# Patient Record
Sex: Female | Born: 1980 | Race: White | Hispanic: No | Marital: Married | Smoking: Current every day smoker
Health system: Southern US, Community
[De-identification: ages and names within clinical notes are randomized; demographics above are authoritative.]

## PROBLEM LIST (undated history)

## (undated) DIAGNOSIS — F32A Depression, unspecified: Secondary | ICD-10-CM

## (undated) DIAGNOSIS — I10 Essential (primary) hypertension: Secondary | ICD-10-CM

## (undated) DIAGNOSIS — F419 Anxiety disorder, unspecified: Secondary | ICD-10-CM

## (undated) HISTORY — DX: Anxiety disorder, unspecified: F41.9

## (undated) HISTORY — DX: Depression, unspecified: F32.A

## (undated) HISTORY — PX: CHOLECYSTECTOMY: SHX55

## (undated) HISTORY — PX: ABDOMINAL HYSTERECTOMY: SHX81

## (undated) HISTORY — PX: OTHER SURGICAL HISTORY: SHX169

---

## 2018-08-07 ENCOUNTER — Other Ambulatory Visit: Payer: Self-pay

## 2018-08-07 ENCOUNTER — Emergency Department (HOSPITAL_COMMUNITY)
Admission: EM | Admit: 2018-08-07 | Discharge: 2018-08-08 | Disposition: A | Discharge status: Home / Self Care | Payer: Self-pay | Attending: Emergency Medicine | Admitting: Emergency Medicine

## 2018-08-07 ENCOUNTER — Encounter (HOSPITAL_COMMUNITY): Payer: Self-pay | Admitting: Emergency Medicine

## 2018-08-07 DIAGNOSIS — R748 Abnormal levels of other serum enzymes: Secondary | ICD-10-CM | POA: Insufficient documentation

## 2018-08-07 DIAGNOSIS — R309 Painful micturition, unspecified: Secondary | ICD-10-CM | POA: Insufficient documentation

## 2018-08-07 DIAGNOSIS — R112 Nausea with vomiting, unspecified: Secondary | ICD-10-CM

## 2018-08-07 DIAGNOSIS — R300 Dysuria: Secondary | ICD-10-CM | POA: Insufficient documentation

## 2018-08-07 DIAGNOSIS — N3000 Acute cystitis without hematuria: Secondary | ICD-10-CM | POA: Insufficient documentation

## 2018-08-07 DIAGNOSIS — F17210 Nicotine dependence, cigarettes, uncomplicated: Secondary | ICD-10-CM | POA: Insufficient documentation

## 2018-08-07 DIAGNOSIS — E876 Hypokalemia: Secondary | ICD-10-CM | POA: Insufficient documentation

## 2018-08-07 LAB — COMPREHENSIVE METABOLIC PANEL
ALBUMIN: 4.4 g/dL (ref 3.5–5.0)
ALT: 76 U/L — AB (ref 0–44)
AST: 59 U/L — AB (ref 15–41)
Alkaline Phosphatase: 69 U/L (ref 38–126)
Anion gap: 9 (ref 5–15)
BUN: 13 mg/dL (ref 6–20)
CHLORIDE: 105 mmol/L (ref 98–111)
CO2: 23 mmol/L (ref 22–32)
CREATININE: 0.8 mg/dL (ref 0.44–1.00)
Calcium: 9 mg/dL (ref 8.9–10.3)
GFR calc Af Amer: >60 mL/min (ref >60)
GFR calc non Af Amer: >60 mL/min (ref >60)
Glucose, Bld: 126 mg/dL — ABNORMAL HIGH (ref 70–99)
Potassium: 3.1 mmol/L — ABNORMAL LOW (ref 3.5–5.1)
Sodium: 137 mmol/L (ref 135–145)
Total Bilirubin: 0.9 mg/dL (ref 0.3–1.2)
Total Protein: 7.7 g/dL (ref 6.5–8.1)

## 2018-08-07 LAB — CBC
HEMATOCRIT: 44.2 % (ref 36.0–46.0)
Hemoglobin: 14.5 g/dL (ref 12.0–15.0)
MCH: 28.9 pg (ref 26.0–34.0)
MCHC: 32.8 g/dL (ref 30.0–36.0)
MCV: 88.2 fL (ref 80.0–100.0)
NRBC: 0 % (ref 0.0–0.2)
Platelets: 325 10*3/uL (ref 150–400)
RBC: 5.01 MIL/uL (ref 3.87–5.11)
RDW: 12.6 % (ref 11.5–15.5)
WBC: 8 10*3/uL (ref 4.0–10.5)

## 2018-08-07 LAB — LIPASE, BLOOD: LIPASE: 26 U/L (ref 11–51)

## 2018-08-07 MED ORDER — SODIUM CHLORIDE 0.9 % IV BOLUS
1000.0000 mL | Freq: Once | INTRAVENOUS | Status: AC
  Administered 2018-08-07: 1000 mL via INTRAVENOUS

## 2018-08-07 MED ORDER — ONDANSETRON HCL 4 MG/2ML IJ SOLN
4.0000 mg | Freq: Once | INTRAMUSCULAR | Status: AC
  Administered 2018-08-07: 4 mg via INTRAVENOUS
  Filled 2018-08-07: qty 2

## 2018-08-07 NOTE — ED Triage Notes (Signed)
Pt states she has been having emesis since yesterday as well as dysuria and "feels kind of swollen". Foul odor to urine noted.

## 2018-08-07 NOTE — ED Provider Notes (Signed)
[facility] EMERGENCY DEPARTMENT Provider Note   CSN: [id number] Arrival date & time: 08/07/18  2216     History   Chief Complaint Chief Complaint  Patient presents with  . Emesis    HPI Hannah Reid is a 37 y.o. female presenting with intermittent episodes of vomiting, describing she spent the majority of last weekend with nausea and infrequent emesis, felt better during the week, but then again had nausea with several episodes of vomiting for the past 2 days.  Additionally, she endorses dysuria, including painful urination, strong smelling urine with urgency and increased frequency with cloudy but not bloody urine production.  She denies fevers or chills, no flank pain but does have aching across her bilateral lower back.  She does have infrequent problems with uti's.  She denies history of kidney stones. Denies abdominal pain except for midline suprapubic pressure, denies vaginal discharge, also no complaint of diarrhea.  She has had no tx prior to arrival.  The history is provided by the patient.    History reviewed. No pertinent past medical history.  There are no active problems to display for this patient.   Past Surgical History:  Procedure Laterality Date  . ABDOMINAL HYSTERECTOMY    . CHOLECYSTECTOMY    . kidney tumor removal       OB History   None      Home Medications    Prior to Admission medications   Medication Sig Start Date End Date Taking? Authorizing Provider  cephALEXin (KEFLEX) 500 MG capsule Take 1 capsule (500 mg total) by mouth 2 (two) times daily. 08/08/18   Burgess Amor, PA-C  potassium chloride SA (K-DUR,KLOR-CON) 20 MEQ tablet Take 1 tablet (20 mEq total) by mouth 2 (two) times daily. 08/08/18   Burgess Amor, PA-C  promethazine (PHENERGAN) 25 MG tablet Take 1 tablet (25 mg total) by mouth every 6 (six) hours as needed for nausea or vomiting. 08/08/18   Burgess Amor, PA-C    Family History History reviewed. No pertinent family  history.  Social History Social History   Tobacco Use  . Smoking status: Current Every Day Smoker    Packs/day: 0.50    Types: Cigarettes  . Smokeless tobacco: Never Used  Substance Use Topics  . Alcohol use: Yes    Comment: socially  . Drug use: Not Currently     Allergies   Patient has no known allergies.   Review of Systems Review of Systems  Constitutional: Negative for chills and fever.  HENT: Negative for congestion and sore throat.   Eyes: Negative.   Respiratory: Negative for chest tightness and shortness of breath.   Cardiovascular: Negative for chest pain.  Gastrointestinal: Positive for nausea and vomiting. Negative for abdominal pain.  Genitourinary: Positive for dysuria, frequency and urgency. Negative for flank pain and vaginal discharge.  Musculoskeletal: Negative for arthralgias, joint swelling and neck pain.  Skin: Negative.  Negative for rash and wound.  Neurological: Negative for dizziness, weakness, light-headedness, numbness and headaches.  Psychiatric/Behavioral: Negative.      Physical Exam Updated Vital Signs BP (!) 132/91 (BP Location: Right Arm)   Pulse 75   Temp 97.9 F (36.6 C) (Oral)   Resp 17   Ht 5\' 5"  (1.651 m)   Wt 120.7 kg   SpO2 99%   BMI 44.28 kg/m   Physical Exam  Constitutional: She appears well-developed and well-nourished.  HENT:  Head: Normocephalic and atraumatic.  Eyes: Conjunctivae are normal.  Neck: Normal range of motion.  Cardiovascular: Normal rate, regular rhythm, normal heart sounds and intact distal pulses.  Pulmonary/Chest: Effort normal and breath sounds normal. She has no wheezes.  Abdominal: Soft. Bowel sounds are normal. She exhibits no mass. There is no tenderness. There is no guarding.  No cva tenderness  Musculoskeletal: Normal range of motion.  Neurological: She is alert.  Skin: Skin is warm and dry.  Psychiatric: She has a normal mood and affect.  Nursing note and vitals reviewed.    ED  Treatments / Results  Labs (all labs ordered are listed, but only abnormal results are displayed) Labs Reviewed  COMPREHENSIVE METABOLIC PANEL - Abnormal; Notable for the following components:      Result Value   Potassium 3.1 (*)    Glucose, Bld 126 (*)    AST 59 (*)    ALT 76 (*)    All other components within normal limits  URINALYSIS, ROUTINE W REFLEX MICROSCOPIC - Abnormal; Notable for the following components:   APPearance HAZY (*)    Hgb urine dipstick SMALL (*)    Nitrite POSITIVE (*)    Leukocytes, UA TRACE (*)    Bacteria, UA MANY (*)    All other components within normal limits  LIPASE, BLOOD  CBC    EKG None  Radiology No results found.  Procedures Procedures (including critical care time)  Medications Ordered in ED Medications  sodium chloride 0.9 % bolus 1,000 mL (0 mLs Intravenous Stopped 08/08/18 0030)  ondansetron (ZOFRAN) injection 4 mg (4 mg Intravenous Given 08/07/18 2343)  potassium chloride SA (K-DUR,KLOR-CON) CR tablet 40 mEq (40 mEq Oral Given 08/08/18 0031)  cephALEXin (KEFLEX) capsule 500 mg (500 mg Oral Given 08/08/18 0104)     Initial Impression / Assessment and Plan / ED Course  I have reviewed the triage vital signs and the nursing notes.  Pertinent labs & imaging results that were available during my care of the patient were reviewed by me and considered in my medical decision making (see chart for details).     Labs reviewed and discussed with patient including low potassium and liver enzyme findings.  Pt explains that she had a "scare" last year prior to moving to [place] to live with mother - stating a local health dept told her her Hep C screening test was positive. She was supposed to return for further testing but never did.  She denies excessive etoh use, last drank one beer 3 days ago.  Pt has a normal lipase so unlikely to be related to gallbladder disease. Pt was strongly encouraged to establish local care here for further evaluation  and management of these findings.  She was started on keflex for the uti, potassium also prescribed for replacement, phenergen for nausea. Strict return precautions discussed.  No cva tenderness, afebrile, doubt pyelonephritis.  Pt tolerated PO intake prior to dc home after tx with zofran.  Prescribed phenergan as pt more likely to be able to afford.  Final Clinical Impressions(s) / ED Diagnoses   Final diagnoses:  Acute cystitis without hematuria  Hypokalemia  Nausea and vomiting, intractability of vomiting not specified, unspecified vomiting type  Elevated liver enzymes    ED Discharge Orders         Ordered    cephALEXin (KEFLEX) 500 MG capsule  2 times daily     08/08/18 0046    promethazine (PHENERGAN) 25 MG tablet  Every 6 hours PRN     08/08/18 0046    potassium chloride SA (K-DUR,KLOR-CON) 20 MEQ  tablet  2 times daily     08/08/18 0046           Burgess Amor, PA-C 08/08/18 1327    Mancel Bale, MD 08/08/18 2351

## 2018-08-08 LAB — URINALYSIS, ROUTINE W REFLEX MICROSCOPIC
Bilirubin Urine: NEGATIVE
Glucose, UA: NEGATIVE mg/dL
Ketones, ur: NEGATIVE mg/dL
Nitrite: POSITIVE — AB
Protein, ur: NEGATIVE mg/dL
Specific Gravity, Urine: 1.02 (ref 1.005–1.030)
pH: 5 (ref 5.0–8.0)

## 2018-08-08 MED ORDER — POTASSIUM CHLORIDE CRYS ER 20 MEQ PO TBCR
40.0000 meq | EXTENDED_RELEASE_TABLET | Freq: Once | ORAL | Status: AC
  Administered 2018-08-08: 40 meq via ORAL
  Filled 2018-08-08: qty 2

## 2018-08-08 MED ORDER — CEPHALEXIN 500 MG PO CAPS
500.0000 mg | ORAL_CAPSULE | Freq: Once | ORAL | Status: AC
  Administered 2018-08-08: 500 mg via ORAL
  Filled 2018-08-08: qty 1

## 2018-08-08 MED ORDER — POTASSIUM CHLORIDE CRYS ER 20 MEQ PO TBCR: 20.0000 meq | EXTENDED_RELEASE_TABLET | Freq: Two times a day (BID) | ORAL | 0 refills | Status: DC

## 2018-08-08 MED ORDER — CEPHALEXIN 500 MG PO CAPS: 500.0000 mg | ORAL_CAPSULE | Freq: Two times a day (BID) | ORAL | 0 refills | Status: DC

## 2018-08-08 MED ORDER — PROMETHAZINE HCL 25 MG PO TABS: 25.0000 mg | ORAL_TABLET | Freq: Four times a day (QID) | ORAL | 0 refills | Status: DC | PRN

## 2018-08-08 NOTE — Discharge Instructions (Signed)
Take the entire course of the antibiotics prescribed, make sure you are drinking plenty of fluids to help flush this infection from your bladder.  Your potassium level was low this evening, possibly due to the vomiting symptom.  I have prescribed you a quantity of potassium tablets to help replace this deficit.  As discussed your liver enzymes are slightly elevated today and will need to get rechecked soon along with possible further testing such as repeating your reported positive hepatitis C test from several years ago.  See the resources above for clinics where you may establish primary care.    [facility]  [address] [place], [place] [address] [phone]  Services [facility] offers a variety of basic health services.  Services include but are not limited to: Blood pressure checks  Heart rate checks  Blood sugar checks  Urine analysis  Rapid strep tests  Pregnancy tests.  Health education and referrals  People needing more complex services will be directed to a physician online. Using these virtual visits, doctors can evaluate and prescribe medicine and treatments. There will be no medication on-site, though [facility] will help patients fill their prescriptions at little to no cost.   For More information please go to: [email or web address]

## 2021-08-09 ENCOUNTER — Other Ambulatory Visit: Payer: Self-pay

## 2021-08-09 ENCOUNTER — Emergency Department
Admission: EM | Admit: 2021-08-09 | Discharge: 2021-08-09 | Disposition: A | Discharge status: Home / Self Care | Payer: Self-pay | Attending: Emergency Medicine | Admitting: Emergency Medicine

## 2021-08-09 DIAGNOSIS — M542 Cervicalgia: Secondary | ICD-10-CM | POA: Insufficient documentation

## 2021-08-09 DIAGNOSIS — F17210 Nicotine dependence, cigarettes, uncomplicated: Secondary | ICD-10-CM | POA: Insufficient documentation

## 2021-08-09 DIAGNOSIS — M25511 Pain in right shoulder: Secondary | ICD-10-CM | POA: Insufficient documentation

## 2021-08-09 MED ORDER — NAPROXEN 500 MG PO TABS: 500.0000 mg | ORAL_TABLET | Freq: Two times a day (BID) | ORAL | 2 refills | Status: DC

## 2021-08-09 MED ORDER — KETOROLAC TROMETHAMINE 30 MG/ML IJ SOLN
30.0000 mg | Freq: Once | INTRAMUSCULAR | Status: AC
  Administered 2021-08-09: 30 mg via INTRAMUSCULAR
  Filled 2021-08-09: qty 1

## 2021-08-09 MED ORDER — PREDNISONE 20 MG PO TABS
60.0000 mg | ORAL_TABLET | Freq: Once | ORAL | Status: AC
  Administered 2021-08-09: 60 mg via ORAL
  Filled 2021-08-09: qty 3

## 2021-08-09 MED ORDER — PREDNISONE 50 MG PO TABS: 50.0000 mg | ORAL_TABLET | Freq: Every day | ORAL | 0 refills | Status: DC

## 2021-08-09 NOTE — ED Triage Notes (Signed)
Pt c/o pain that started In her right FA and is now the whole arm in to the neck and shoulder, worse with movement, states it started at work

## 2021-08-09 NOTE — ED Provider Notes (Signed)
[facility] Emergency Department Provider Note   ____________________________________________    I have reviewed the triage vital signs and the nursing notes.   HISTORY  Chief Complaint Neck Pain and Arm Pain     HPI Hannah Reid is a 40 y.o. female who presents with complaints of right-sided neck pain and shoulder pain.  Patient reports over the last several days it has become quite painful for her to try to abduct her right arm.  She has pain in her shoulder and in her neck when she does this.  Occasionally radiates down to her right hand.  She denies numbness or weakness.  Denies injury to the area.  Has never had this before.  History reviewed. No pertinent past medical history.  There are no problems to display for this patient.   Past Surgical History:  Procedure Laterality Date   ABDOMINAL HYSTERECTOMY     CHOLECYSTECTOMY     kidney tumor removal      Prior to Admission medications   Medication Sig Start Date End Date Taking? Authorizing Provider  naproxen (NAPROSYN) 500 MG tablet Take 1 tablet (500 mg total) by mouth 2 (two) times daily with a meal. 08/09/21  Yes Jene Every, MD  predniSONE (DELTASONE) 50 MG tablet Take 1 tablet (50 mg total) by mouth daily with breakfast. 08/10/21  Yes Jene Every, MD  cephALEXin (KEFLEX) 500 MG capsule Take 1 capsule (500 mg total) by mouth 2 (two) times daily. 08/08/18   Burgess Amor, PA-C  potassium chloride SA (K-DUR,KLOR-CON) 20 MEQ tablet Take 1 tablet (20 mEq total) by mouth 2 (two) times daily. 08/08/18   Burgess Amor, PA-C  promethazine (PHENERGAN) 25 MG tablet Take 1 tablet (25 mg total) by mouth every 6 (six) hours as needed for nausea or vomiting. 08/08/18   Burgess Amor, PA-C     Allergies Patient has no known allergies.  No family history on file.  Social History Social History   Tobacco Use   Smoking status: Every Day    Packs/day: 0.50    Types: Cigarettes   Smokeless  tobacco: Never  Substance Use Topics   Alcohol use: Yes    Comment: socially   Drug use: Not Currently    Review of Systems  Constitutional: No fever/chills   CV: No chest pain   Musculoskeletal: As above Skin: Negative for rash. Neurological: Negative for headaches, no numbness or weakness    ____________________________________________   PHYSICAL EXAM:  VITAL SIGNS: ED Triage Vitals  Enc Vitals Group     BP 08/09/21 0915 (!) 148/97     Pulse Rate 08/09/21 0915 74     Resp 08/09/21 0915 16     Temp 08/09/21 0917 97.9 F (36.6 C)     Temp Source 08/09/21 0915 Oral     SpO2 08/09/21 0915 100 %     Weight 08/09/21 0915 102.1 kg (225 lb)     Height 08/09/21 0915 1.626 m (5\' 4" )     Head Circumference --      Peak Flow --      Pain Score 08/09/21 0915 10     Pain Loc --      Pain Edu? --      Excl. in GC? --      Constitutional: Alert and oriented. No acute distress. Pleasant and interactive Eyes: Conjunctivae are normal.  Head: Atraumatic. Nose: No congestion/rhinnorhea. Mouth/Throat: Mucous membranes are moist.   Cardiovascular: Normal rate, regular rhythm.  Respiratory: Normal respiratory effort.  No retractions. Genitourinary: deferred Musculoskeletal: Patient has pain with abducting the right arm even at approximately 15 to 20 degrees, the pain is primarily in the posterior shoulder and lateral neck.  No vertebral tenderness to palpation, no pain with axial load on the neck.  No weakness Neurologic:  Normal speech and language. No gross focal neurologic deficits are appreciated.   Skin:  Skin is warm, dry and intact. No rash noted.   ____________________________________________   LABS (all labs ordered are listed, but only abnormal results are displayed)  Labs Reviewed - No data to  display ____________________________________________  EKG   ____________________________________________  RADIOLOGY   ____________________________________________   PROCEDURES  Procedure(s) performed: No  Procedures   Critical Care performed: No ____________________________________________   INITIAL IMPRESSION / ASSESSMENT AND PLAN / ED COURSE  Pertinent labs & imaging results that were available during my care of the patient were reviewed by me and considered in my medical decision making (see chart for details).   Presentation is most consistent with either a cervical radiculopathy versus a rotator cuff injury.  Treated with IM Toradol, course of prednisone, patient will need outpatient follow-up for further evaluation and work-up if no improvement   ____________________________________________   FINAL CLINICAL IMPRESSION(S) / ED DIAGNOSES  Final diagnoses:  Acute pain of right shoulder      NEW MEDICATIONS STARTED DURING THIS VISIT:  Discharge Medication List as of 08/09/2021 10:27 AM     START taking these medications   Details  naproxen (NAPROSYN) 500 MG tablet Take 1 tablet (500 mg total) by mouth 2 (two) times daily with a meal., Starting Sun 08/09/2021, Normal    predniSONE (DELTASONE) 50 MG tablet Take 1 tablet (50 mg total) by mouth daily with breakfast., Starting Mon 08/10/2021, Normal         Note:  This document was prepared using Dragon voice recognition software and may include unintentional dictation errors.    Jene Every, MD 08/09/21 [phone]

## 2021-08-26 ENCOUNTER — Other Ambulatory Visit: Payer: Self-pay

## 2021-08-26 ENCOUNTER — Emergency Department: Payer: Self-pay

## 2021-08-26 ENCOUNTER — Emergency Department
Admission: EM | Admit: 2021-08-26 | Discharge: 2021-08-26 | Disposition: A | Discharge status: Home / Self Care | Payer: Self-pay | Attending: Student in an Organized Health Care Education/Training Program | Admitting: Student in an Organized Health Care Education/Training Program

## 2021-08-26 DIAGNOSIS — F17210 Nicotine dependence, cigarettes, uncomplicated: Secondary | ICD-10-CM | POA: Insufficient documentation

## 2021-08-26 DIAGNOSIS — J111 Influenza due to unidentified influenza virus with other respiratory manifestations: Secondary | ICD-10-CM

## 2021-08-26 DIAGNOSIS — R0981 Nasal congestion: Secondary | ICD-10-CM | POA: Insufficient documentation

## 2021-08-26 DIAGNOSIS — R059 Cough, unspecified: Secondary | ICD-10-CM | POA: Insufficient documentation

## 2021-08-26 DIAGNOSIS — R519 Headache, unspecified: Secondary | ICD-10-CM | POA: Insufficient documentation

## 2021-08-26 DIAGNOSIS — Z20822 Contact with and (suspected) exposure to covid-19: Secondary | ICD-10-CM | POA: Insufficient documentation

## 2021-08-26 DIAGNOSIS — R110 Nausea: Secondary | ICD-10-CM | POA: Insufficient documentation

## 2021-08-26 LAB — COMPREHENSIVE METABOLIC PANEL
ALT: 27 U/L (ref 0–44)
AST: 21 U/L (ref 15–41)
Albumin: 4.3 g/dL (ref 3.5–5.0)
Alkaline Phosphatase: 52 U/L (ref 38–126)
Anion gap: 7 (ref 5–15)
BUN: 14 mg/dL (ref 6–20)
CO2: 24 mmol/L (ref 22–32)
Calcium: 8.9 mg/dL (ref 8.9–10.3)
Chloride: 107 mmol/L (ref 98–111)
Creatinine, Ser: 0.7 mg/dL (ref 0.44–1.00)
GFR, Estimated: >60 mL/min (ref >60)
Glucose, Bld: 95 mg/dL (ref 70–99)
Potassium: 3.7 mmol/L (ref 3.5–5.1)
Sodium: 138 mmol/L (ref 135–145)
Total Bilirubin: 1.1 mg/dL (ref 0.3–1.2)
Total Protein: 7 g/dL (ref 6.5–8.1)

## 2021-08-26 LAB — CBC
HCT: 40.8 % (ref 36.0–46.0)
Hemoglobin: 14.4 g/dL (ref 12.0–15.0)
MCH: 30.1 pg (ref 26.0–34.0)
MCHC: 35.3 g/dL (ref 30.0–36.0)
MCV: 85.2 fL (ref 80.0–100.0)
Platelets: 274 10*3/uL (ref 150–400)
RBC: 4.79 MIL/uL (ref 3.87–5.11)
RDW: 11.9 % (ref 11.5–15.5)
WBC: 5 10*3/uL (ref 4.0–10.5)
nRBC: 0 % (ref 0.0–0.2)

## 2021-08-26 LAB — POC URINE PREG, ED: Preg Test, Ur: NEGATIVE

## 2021-08-26 LAB — URINALYSIS, ROUTINE W REFLEX MICROSCOPIC
Bacteria, UA: NONE SEEN
Bilirubin Urine: NEGATIVE
Glucose, UA: NEGATIVE mg/dL
Hgb urine dipstick: NEGATIVE
Ketones, ur: NEGATIVE mg/dL
Leukocytes,Ua: NEGATIVE
Nitrite: NEGATIVE
Protein, ur: NEGATIVE mg/dL
Specific Gravity, Urine: 1.025 (ref 1.005–1.030)
pH: 6.5 (ref 5.0–8.0)

## 2021-08-26 LAB — RESP PANEL BY RT-PCR (FLU A&B, COVID) ARPGX2
Influenza A by PCR: NEGATIVE
Influenza B by PCR: NEGATIVE
SARS Coronavirus 2 by RT PCR: NEGATIVE

## 2021-08-26 MED ORDER — SODIUM CHLORIDE 0.9 % IV BOLUS
1000.0000 mL | Freq: Once | INTRAVENOUS | Status: AC
  Administered 2021-08-26: 1000 mL via INTRAVENOUS

## 2021-08-26 MED ORDER — PROCHLORPERAZINE MALEATE 10 MG PO TABS: 10.0000 mg | ORAL_TABLET | Freq: Three times a day (TID) | ORAL | 0 refills | Status: DC | PRN

## 2021-08-26 MED ORDER — ACETAMINOPHEN 500 MG PO TABS
1000.0000 mg | ORAL_TABLET | Freq: Once | ORAL | Status: AC
  Administered 2021-08-26: 1000 mg via ORAL
  Filled 2021-08-26: qty 2

## 2021-08-26 MED ORDER — DIPHENHYDRAMINE HCL 50 MG/ML IJ SOLN
12.5000 mg | Freq: Once | INTRAMUSCULAR | Status: AC
  Administered 2021-08-26: 12.5 mg via INTRAVENOUS
  Filled 2021-08-26: qty 1

## 2021-08-26 MED ORDER — PROCHLORPERAZINE EDISYLATE 10 MG/2ML IJ SOLN
10.0000 mg | Freq: Once | INTRAMUSCULAR | Status: AC
  Administered 2021-08-26: 10 mg via INTRAVENOUS
  Filled 2021-08-26: qty 2

## 2021-08-26 NOTE — ED Notes (Signed)
Pt was allowed to be accompanied by 2 people and had been cared for per protocol. A few minutes ago, pt and 2 friends complained that IV was not working, that they had already asked for help and were not helped, that the triage nurses were rude, that someone left tylenol on the counter in the room, and that they wanted to "talk to patient relations" to complain.   IV removed per pt request, even though it had not infused anything yet and was a working IV.  Preparing for discharge.

## 2021-08-26 NOTE — ED Provider Notes (Signed)
[facility] Emergency Department Provider Note    Event Date/Time   First MD Initiated Contact with Patient 08/26/21 [phone]     (approximate)  I have reviewed the triage vital signs and the nursing notes.   HISTORY  Chief Complaint URI    HPI Hannah Reid is a 40 y.o. female no significant past medical history presents to the ER for evaluation of several days of cough congestion headache body aches chills and cold sweats.  Is having some nausea no vomiting.  Does have a history of migraine headaches.  Came to the ER today because she was having worsening headache after taking Aleve.  No numbness or tingling.  States she has had some abdominal pain from frequent coughing.  Denies any dysuria or back pain.  No diarrhea.  No current antibiotics  No past medical history on file. No family history on file. Past Surgical History:  Procedure Laterality Date   ABDOMINAL HYSTERECTOMY     CHOLECYSTECTOMY     kidney tumor removal     There are no problems to display for this patient.     Prior to Admission medications   Medication Sig Start Date End Date Taking? Authorizing Provider  prochlorperazine (COMPAZINE) 10 MG tablet Take 1 tablet (10 mg total) by mouth every 8 (eight) hours as needed for nausea or vomiting. 08/26/21  Yes Willy Eddy, MD  cephALEXin (KEFLEX) 500 MG capsule Take 1 capsule (500 mg total) by mouth 2 (two) times daily. 08/08/18   Burgess Amor, PA-C  naproxen (NAPROSYN) 500 MG tablet Take 1 tablet (500 mg total) by mouth 2 (two) times daily with a meal. 08/09/21   Jene Every, MD  potassium chloride SA (K-DUR,KLOR-CON) 20 MEQ tablet Take 1 tablet (20 mEq total) by mouth 2 (two) times daily. 08/08/18   Idol, Raynelle Fanning, PA-C  predniSONE (DELTASONE) 50 MG tablet Take 1 tablet (50 mg total) by mouth daily with breakfast. 08/10/21   Jene Every, MD  promethazine (PHENERGAN) 25 MG tablet Take 1 tablet (25 mg total) by mouth every 6 (six)  hours as needed for nausea or vomiting. 08/08/18   Burgess Amor, PA-C    Allergies Patient has no known allergies.    Social History Social History   Tobacco Use   Smoking status: Every Day    Packs/day: 0.50    Types: Cigarettes   Smokeless tobacco: Never  Substance Use Topics   Alcohol use: Yes    Comment: socially   Drug use: Not Currently    Review of Systems Patient denies headaches, rhinorrhea, blurry vision, numbness, shortness of breath, chest pain, edema, cough, abdominal pain, nausea, vomiting, diarrhea, dysuria, fevers, rashes or hallucinations unless otherwise stated above in HPI. ____________________________________________   PHYSICAL EXAM:  VITAL SIGNS: Vitals:   08/26/21 0823 08/26/21 0924  BP: (!) 149/111 (!) 163/114  Pulse: 66 70  Resp: 17 18  Temp: 98 F (36.7 C)   SpO2: 98% 95%    Constitutional: Alert and oriented.  Eyes: Conjunctivae are normal.  Head: Atraumatic. Nose: + congestion/rhinnorhea. Mouth/Throat: Mucous membranes are moist.   Neck: No stridor. Painless ROM.  Cardiovascular: Normal rate, regular rhythm. Grossly normal heart sounds.  Good peripheral circulation. Respiratory: Normal respiratory effort.  No retractions. Lungs CTAB. Gastrointestinal: Soft and nontender. No distention.  Genitourinary:  Musculoskeletal: No lower extremity tenderness nor edema.  No joint effusions. Neurologic:  Normal speech and language. No gross focal neurologic deficits are appreciated. No facial droop Skin:  Skin is warm, dry and intact. No rash noted. Psychiatric: Mood and affect are normal. Speech and behavior are normal.  ____________________________________________   LABS (all labs ordered are listed, but only abnormal results are displayed)  Results for orders placed or performed during the hospital encounter of 08/26/21 (from the past 24 hour(s))  Resp Panel by RT-PCR (Flu A&B, Covid) Nasopharyngeal Swab     Status: None   Collection  Time: 08/26/21  9:52 AM   Specimen: Nasopharyngeal Swab; Nasopharyngeal(NP) swabs in vial transport medium  Result Value Ref Range   SARS Coronavirus 2 by RT PCR NEGATIVE NEGATIVE   Influenza A by PCR NEGATIVE NEGATIVE   Influenza B by PCR NEGATIVE NEGATIVE  Urinalysis, Routine w reflex microscopic Nasopharyngeal Swab     Status: None   Collection Time: 08/26/21  9:52 AM  Result Value Ref Range   Color, Urine YELLOW YELLOW   APPearance CLEAR CLEAR   Specific Gravity, Urine 1.025 1.005 - 1.030   pH 6.5 5.0 - 8.0   Glucose, UA NEGATIVE NEGATIVE mg/dL   Hgb urine dipstick NEGATIVE NEGATIVE   Bilirubin Urine NEGATIVE NEGATIVE   Ketones, ur NEGATIVE NEGATIVE mg/dL   Protein, ur NEGATIVE NEGATIVE mg/dL   Nitrite NEGATIVE NEGATIVE   Leukocytes,Ua NEGATIVE NEGATIVE   RBC / HPF 0-5 0 - 5 RBC/hpf   WBC, UA 0-5 0 - 5 WBC/hpf   Bacteria, UA NONE SEEN NONE SEEN   Squamous Epithelial / LPF 6-10 0 - 5   Mucus PRESENT   CBC     Status: None   Collection Time: 08/26/21  9:52 AM  Result Value Ref Range   WBC 5.0 4.0 - 10.5 K/uL   RBC 4.79 3.87 - 5.11 MIL/uL   Hemoglobin 14.4 12.0 - 15.0 g/dL   HCT 86.5 78.4 - 69.6 %   MCV 85.2 80.0 - 100.0 fL   MCH 30.1 26.0 - 34.0 pg   MCHC 35.3 30.0 - 36.0 g/dL   RDW 29.5 28.4 - 13.2 %   Platelets 274 150 - 400 K/uL   nRBC 0.0 0.0 - 0.2 %  Comprehensive metabolic panel     Status: None   Collection Time: 08/26/21  9:52 AM  Result Value Ref Range   Sodium 138 135 - 145 mmol/L   Potassium 3.7 3.5 - 5.1 mmol/L   Chloride 107 98 - 111 mmol/L   CO2 24 22 - 32 mmol/L   Glucose, Bld 95 70 - 99 mg/dL   BUN 14 6 - 20 mg/dL   Creatinine, Ser 4.40 0.44 - 1.00 mg/dL   Calcium 8.9 8.9 - 10.2 mg/dL   Total Protein 7.0 6.5 - 8.1 g/dL   Albumin 4.3 3.5 - 5.0 g/dL   AST 21 15 - 41 U/L   ALT 27 0 - 44 U/L   Alkaline Phosphatase 52 38 - 126 U/L   Total Bilirubin 1.1 0.3 - 1.2 mg/dL   GFR, Estimated >72 >53 mL/min   Anion gap 7 5 - 15  POC Urine Pregnancy, ED      Status: None   Collection Time: 08/26/21 10:26 AM  Result Value Ref Range   Preg Test, Ur Negative Negative   ____________________________________________ ____________________________________________  RADIOLOGY  I personally reviewed all radiographic images ordered to evaluate for the above acute complaints and reviewed radiology reports and findings.  These findings were personally discussed with the patient.  Please see medical record for radiology report.  ____________________________________________   PROCEDURES  Procedure(s) performed:  Procedures    Critical Care performed: no ____________________________________________   INITIAL IMPRESSION / ASSESSMENT AND PLAN / ED COURSE  Pertinent labs & imaging results that were available during my care of the patient were reviewed by me and considered in my medical decision making (see chart for details).   DDX: uri, flu, covid, migraine, tension, dehydration, pna, uti, sepsis  Hannah Reid is a 40 y.o. who presents to the ED with flulike illness as described above.  Patient clinically nontoxic-appearing reassuring exam.  No meningismus.  Will give IV fluids as well as migraine cocktail we will check blood work.  Do not feel that neuroimaging clinically indicated.  Not worst headache of her life not sudden onset not consistent with subarachnoid.  She has been having quite a bit of congestion cough have a higher suspicion for viral illness we will send for flu swabs and COVID.  Clinical Course as of 08/26/21 1146  Wed Aug 26, 2021  1054 Patient feels significantly improved after migraine cocktail. [PR]    Clinical Course User Index [PR] Willy Eddy, MD   Patient now tolerating p.o. sitting up in lighted room does not appear to be in any distress.  Does appear stable and appropriate for outpatient follow-up.  We discussed strict return precautions.  Patient agreeable to plan.  The patient was evaluated in Emergency  Department today for the symptoms described in the history of present illness. He/she was evaluated in the context of the global COVID-19 pandemic, which necessitated consideration that the patient might be at risk for infection with the SARS-CoV-2 virus that causes COVID-19. Institutional protocols and algorithms that pertain to the evaluation of patients at risk for COVID-19 are in a state of rapid change based on information released by regulatory bodies including the CDC and federal and state organizations. These policies and algorithms were followed during the patient's care in the ED.  As part of my medical decision making, I reviewed the following data within the electronic MEDICAL RECORD NUMBER Nursing notes reviewed and incorporated, Labs reviewed, notes from prior ED visits and  Controlled Substance Database   ____________________________________________   FINAL CLINICAL IMPRESSION(S) / ED DIAGNOSES  Final diagnoses:  Influenza-like illness      NEW MEDICATIONS STARTED DURING THIS VISIT:  New Prescriptions   PROCHLORPERAZINE (COMPAZINE) 10 MG TABLET    Take 1 tablet (10 mg total) by mouth every 8 (eight) hours as needed for nausea or vomiting.     Note:  This document was prepared using Dragon voice recognition software and may include unintentional dictation errors.    Willy Eddy, MD 08/26/21 1147

## 2021-08-26 NOTE — ED Triage Notes (Signed)
Pt c/o HA with nausea, sweats, body aches and cough for the past couple of days

## 2021-08-26 NOTE — ED Notes (Signed)
ED provider at bedside.

## 2021-08-26 NOTE — ED Notes (Signed)
EDP at bedside again. 

## 2021-08-26 NOTE — ED Notes (Signed)
Pt to ED with family c/o nausea, no appetite, HA and body aches, chills, and cough since past couple days. Woke up this morning in cold sweat.  States BP was "really high" yesterday (SBP 151) and that has been feeling dizzy for 2-3 days. Has not fainted but feels very weak. Denies known fevers.

## 2021-08-26 NOTE — Discharge Instructions (Signed)

## 2021-10-15 ENCOUNTER — Emergency Department
Admission: EM | Admit: 2021-10-15 | Discharge: 2021-10-15 | Disposition: A | Discharge status: Home / Self Care | Payer: Self-pay | Attending: Emergency Medicine | Admitting: Emergency Medicine

## 2021-10-15 ENCOUNTER — Other Ambulatory Visit: Payer: Self-pay

## 2021-10-15 ENCOUNTER — Encounter: Payer: Self-pay | Admitting: Emergency Medicine

## 2021-10-15 DIAGNOSIS — S51812A Laceration without foreign body of left forearm, initial encounter: Secondary | ICD-10-CM | POA: Insufficient documentation

## 2021-10-15 DIAGNOSIS — Z23 Encounter for immunization: Secondary | ICD-10-CM | POA: Insufficient documentation

## 2021-10-15 MED ORDER — TETANUS-DIPHTH-ACELL PERTUSSIS 5-2.5-18.5 LF-MCG/0.5 IM SUSY
0.5000 mL | PREFILLED_SYRINGE | Freq: Once | INTRAMUSCULAR | Status: AC
  Administered 2021-10-15: 0.5 mL via INTRAMUSCULAR
  Filled 2021-10-15: qty 0.5

## 2021-10-15 MED ORDER — NAPROXEN 500 MG PO TABS: 500.0000 mg | ORAL_TABLET | Freq: Two times a day (BID) | ORAL | 2 refills | Status: DC

## 2021-10-15 MED ORDER — HYDROCODONE-ACETAMINOPHEN 5-325 MG PO TABS
1.0000 | ORAL_TABLET | Freq: Once | ORAL | Status: AC
  Administered 2021-10-15: 1 via ORAL
  Filled 2021-10-15: qty 1

## 2021-10-15 MED ORDER — LIDOCAINE HCL (PF) 1 % IJ SOLN
5.0000 mL | Freq: Once | INTRAMUSCULAR | Status: AC
  Administered 2021-10-15: 5 mL via INTRADERMAL
  Filled 2021-10-15: qty 5

## 2021-10-15 MED ORDER — LORAZEPAM 1 MG PO TABS
1.0000 mg | ORAL_TABLET | Freq: Once | ORAL | Status: AC
  Administered 2021-10-15: 1 mg via ORAL
  Filled 2021-10-15: qty 1

## 2021-10-15 MED ORDER — LIDOCAINE-EPINEPHRINE-TETRACAINE (LET) TOPICAL GEL
3.0000 mL | Freq: Once | TOPICAL | Status: AC
  Administered 2021-10-15: 3 mL via TOPICAL
  Filled 2021-10-15: qty 3

## 2021-10-15 MED ORDER — BACITRACIN-NEOMYCIN-POLYMYXIN 400-5-5000 EX OINT
TOPICAL_OINTMENT | Freq: Once | CUTANEOUS | Status: AC
  Filled 2021-10-15: qty 1

## 2021-10-15 NOTE — Discharge Instructions (Signed)
Do not get the sutured area wet for 24 hours. After 24 hours, shower/bathe as usual and pat the area dry. Change the bandage 2 times per day and apply antibiotic ointment. Leave open to air when at no risk of getting the area dirty, but cover at night before bed. See your PCP or go to Urgent Care in 7 days for suture removal or sooner for signs or concern of infection.  
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
## 2021-10-16 ENCOUNTER — Other Ambulatory Visit: Payer: Self-pay

## 2021-10-16 DIAGNOSIS — R456 Violent behavior: Secondary | ICD-10-CM | POA: Insufficient documentation

## 2021-10-16 DIAGNOSIS — Z20822 Contact with and (suspected) exposure to covid-19: Secondary | ICD-10-CM | POA: Insufficient documentation

## 2021-10-16 DIAGNOSIS — R45851 Suicidal ideations: Secondary | ICD-10-CM | POA: Insufficient documentation

## 2021-10-16 LAB — COMPREHENSIVE METABOLIC PANEL
ALT: 18 U/L (ref 0–44)
AST: 21 U/L (ref 15–41)
Albumin: 4.4 g/dL (ref 3.5–5.0)
Alkaline Phosphatase: 61 U/L (ref 38–126)
Anion gap: 7 (ref 5–15)
BUN: 18 mg/dL (ref 6–20)
CO2: 27 mmol/L (ref 22–32)
Calcium: 9.4 mg/dL (ref 8.9–10.3)
Chloride: 108 mmol/L (ref 98–111)
Creatinine, Ser: 0.8 mg/dL (ref 0.44–1.00)
GFR, Estimated: >60 mL/min (ref >60)
Glucose, Bld: 102 mg/dL — ABNORMAL HIGH (ref 70–99)
Potassium: 3.5 mmol/L (ref 3.5–5.1)
Sodium: 142 mmol/L (ref 135–145)
Total Bilirubin: 0.5 mg/dL (ref 0.3–1.2)
Total Protein: 7.5 g/dL (ref 6.5–8.1)

## 2021-10-16 LAB — URINE DRUG SCREEN, QUALITATIVE (ARMC ONLY)
Amphetamines, Ur Screen: NOT DETECTED
Barbiturates, Ur Screen: NOT DETECTED
Benzodiazepine, Ur Scrn: NOT DETECTED
Cannabinoid 50 Ng, Ur ~~LOC~~: POSITIVE — AB
Cocaine Metabolite,Ur ~~LOC~~: NOT DETECTED
MDMA (Ecstasy)Ur Screen: NOT DETECTED
Methadone Scn, Ur: NOT DETECTED
Opiate, Ur Screen: POSITIVE — AB
Phencyclidine (PCP) Ur S: NOT DETECTED
Tricyclic, Ur Screen: POSITIVE — AB

## 2021-10-16 LAB — CBC
HCT: 41.5 % (ref 36.0–46.0)
Hemoglobin: 14.5 g/dL (ref 12.0–15.0)
MCH: 30.8 pg (ref 26.0–34.0)
MCHC: 34.9 g/dL (ref 30.0–36.0)
MCV: 88.1 fL (ref 80.0–100.0)
Platelets: 340 10*3/uL (ref 150–400)
RBC: 4.71 MIL/uL (ref 3.87–5.11)
RDW: 13 % (ref 11.5–15.5)
WBC: 9.3 10*3/uL (ref 4.0–10.5)
nRBC: 0 % (ref 0.0–0.2)

## 2021-10-16 LAB — SALICYLATE LEVEL: Salicylate Lvl: <7 mg/dL — ABNORMAL LOW (ref 7.0–30.0)

## 2021-10-16 LAB — ETHANOL: Alcohol, Ethyl (B): <10 mg/dL (ref <10)

## 2021-10-16 LAB — ACETAMINOPHEN LEVEL: Acetaminophen (Tylenol), Serum: <10 ug/mL — ABNORMAL LOW (ref 10–30)

## 2021-10-16 NOTE — ED Triage Notes (Signed)
Pt states is suicidal. Pt with sutured laceration noted to left forearm. Pt comes in voluntary, is tearful and cooperative.

## 2021-10-17 ENCOUNTER — Inpatient Hospital Stay
Admission: RE | Admit: 2021-10-17 | Discharge: 2021-10-20 | DRG: 885 | Disposition: A | Discharge status: Home / Self Care | Payer: 59 | Source: Intra-hospital | Attending: Psychiatry | Admitting: Psychiatry

## 2021-10-17 ENCOUNTER — Encounter: Payer: Self-pay | Admitting: Psychiatry

## 2021-10-17 ENCOUNTER — Other Ambulatory Visit: Payer: Self-pay

## 2021-10-17 ENCOUNTER — Emergency Department
Admission: EM | Admit: 2021-10-17 | Discharge: 2021-10-17 | Disposition: A | Discharge status: Other Acute Inpatient Hospital | Payer: 59 | Attending: Emergency Medicine | Admitting: Emergency Medicine

## 2021-10-17 DIAGNOSIS — F17210 Nicotine dependence, cigarettes, uncomplicated: Secondary | ICD-10-CM | POA: Diagnosis present

## 2021-10-17 DIAGNOSIS — R4689 Other symptoms and signs involving appearance and behavior: Secondary | ICD-10-CM | POA: Diagnosis not present

## 2021-10-17 DIAGNOSIS — I10 Essential (primary) hypertension: Secondary | ICD-10-CM | POA: Diagnosis present

## 2021-10-17 DIAGNOSIS — G4700 Insomnia, unspecified: Secondary | ICD-10-CM | POA: Diagnosis present

## 2021-10-17 DIAGNOSIS — F332 Major depressive disorder, recurrent severe without psychotic features: Secondary | ICD-10-CM | POA: Diagnosis present

## 2021-10-17 DIAGNOSIS — Z20822 Contact with and (suspected) exposure to covid-19: Secondary | ICD-10-CM | POA: Diagnosis present

## 2021-10-17 DIAGNOSIS — R45851 Suicidal ideations: Secondary | ICD-10-CM | POA: Diagnosis present

## 2021-10-17 DIAGNOSIS — F322 Major depressive disorder, single episode, severe without psychotic features: Secondary | ICD-10-CM | POA: Diagnosis present

## 2021-10-17 LAB — RESP PANEL BY RT-PCR (FLU A&B, COVID) ARPGX2
Influenza A by PCR: NEGATIVE
Influenza B by PCR: NEGATIVE
SARS Coronavirus 2 by RT PCR: NEGATIVE

## 2021-10-17 MED ORDER — QUETIAPINE FUMARATE 25 MG PO TABS: 50.0000 mg | ORAL_TABLET | Freq: Every day | ORAL | Status: DC

## 2021-10-17 MED ORDER — LORAZEPAM 1 MG PO TABS
1.0000 mg | ORAL_TABLET | Freq: Once | ORAL | Status: AC
  Administered 2021-10-17: 1 mg via ORAL
  Filled 2021-10-17: qty 1

## 2021-10-17 MED ORDER — ALUM & MAG HYDROXIDE-SIMETH 200-200-20 MG/5ML PO SUSP: 30.0000 mL | ORAL | Status: DC | PRN

## 2021-10-17 MED ORDER — GABAPENTIN 100 MG PO CAPS
100.0000 mg | ORAL_CAPSULE | Freq: Three times a day (TID) | ORAL | Status: DC
  Administered 2021-10-17: 100 mg via ORAL
  Filled 2021-10-17: qty 1

## 2021-10-17 MED ORDER — ALBUTEROL SULFATE (2.5 MG/3ML) 0.083% IN NEBU
2.5000 mg | INHALATION_SOLUTION | Freq: Once | RESPIRATORY_TRACT | Status: AC
  Administered 2021-10-17: 2.5 mg via RESPIRATORY_TRACT
  Filled 2021-10-17: qty 3

## 2021-10-17 MED ORDER — MAGNESIUM HYDROXIDE 400 MG/5ML PO SUSP: 30.0000 mL | Freq: Every day | ORAL | Status: DC | PRN

## 2021-10-17 MED ORDER — TRAZODONE HCL 100 MG PO TABS
100.0000 mg | ORAL_TABLET | Freq: Every day | ORAL | Status: DC
  Administered 2021-10-17: 100 mg via ORAL
  Filled 2021-10-17: qty 1

## 2021-10-17 MED ORDER — ACETAMINOPHEN 325 MG PO TABS: 650.0000 mg | ORAL_TABLET | Freq: Four times a day (QID) | ORAL | Status: DC | PRN

## 2021-10-17 MED ORDER — TRAZODONE HCL 100 MG PO TABS: 100.0000 mg | ORAL_TABLET | Freq: Every day | ORAL | Status: DC

## 2021-10-17 MED ORDER — GABAPENTIN 100 MG PO CAPS
100.0000 mg | ORAL_CAPSULE | Freq: Three times a day (TID) | ORAL | Status: DC
  Administered 2021-10-17 – 2021-10-20 (×9): 100 mg via ORAL
  Filled 2021-10-17 (×9): qty 1

## 2021-10-17 MED ORDER — QUETIAPINE FUMARATE 25 MG PO TABS
50.0000 mg | ORAL_TABLET | Freq: Every day | ORAL | Status: DC
  Administered 2021-10-17: 50 mg via ORAL
  Filled 2021-10-17: qty 2
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
## 2021-10-17 NOTE — ED Notes (Signed)
Pt given breakfast tray, is asleep at this time.  

## 2021-10-17 NOTE — ED Notes (Signed)
When this rn walked up to pts room, pt was found banging head on wall. This rn advised pt to stop banging head on wall. Pt then stated "None of yall care about me" while becoming increasingly agitated. This rn attempted to de-escalate by reassuring her that we are doing something and that we do care. Pt stopped banging head on wall.
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
## 2021-10-17 NOTE — ED Notes (Signed)
Pt is currently asleep, will obtain vitals when she wakes up.
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
## 2021-10-17 NOTE — BH Assessment (Signed)
Patient is to be admitted to [facility] by Psychiatric Nurse Practitioner Nanine Means.  Attending Physician will be Dr.  Marlou Porch .   Patient has been assigned to room 303, by [facility] Charge Nurse Wendall Mola, RN.   Intake Paper Work has been signed and placed on patient chart.    ER staff is aware of the admission: Ronnie, ER Secretary   Dr. Katrinka Blazing, ER MD  Tresa Endo, Patient's Nurse  Alli, Patient Access.

## 2021-10-17 NOTE — Tx Team (Signed)
Initial Treatment Plan 10/17/2021 4:51 PM Hannah Reid [id number]    PATIENT STRESSORS: Financial difficulties   Loss of employment     PATIENT STRENGTHS: Motivation for treatment/growth    PATIENT IDENTIFIED PROBLEMS: Anxiety, Depression, difficulty sleeping                     DISCHARGE CRITERIA:  Improved stabilization in mood, thinking, and/or behavior  PRELIMINARY DISCHARGE PLAN: Return to previous living arrangement  PATIENT/FAMILY INVOLVEMENT: This treatment plan has been presented to and reviewed with the patient, [name].  The patient and family have been given the opportunity to ask questions and make suggestions.  Donette Larry, RN 10/17/2021, 4:51 PM

## 2021-10-17 NOTE — ED Notes (Signed)
Pt states that shes "not going to sleep on this bed" and proceeds to lay on floor. Warm blanket and pillow given to pt.
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
## 2021-10-17 NOTE — Consult Note (Signed)
[facility] Face-to-Face Psychiatry Consult   Reason for Consult:  psych evaluation Referring Physician:  Dr. York CeriseForbach Patient Identification: Hannah Reid MRN:  [id number] Principal Diagnosis: <principal problem not specified> Diagnosis:  Active Problems:   * No active hospital problems. *   Total Time spent with patient: 45 minutes  Subjective:     HPI:  Hannah Reid, 41 y.o., female patient seen by TTS and this provider; chart reviewed and consulted with Dr. York CeriseForbach on 10/17/21.  On evaluation Hannah Reid reports per TTS, During assessment patient appears alert and oriented x4, patient can be seen standing in the corner of her room visibly angry and irritable. Before patient was brought to there room patient became angry while in the waiting room and complaining that she had been waiting for hours to be evaluated, patient was ultimately IVC'd due to threatening to leave and go home and slit her throat. Patient reports "I've had mental health issues all my life and I've reached my breakdown." "Don't nobody do anything!" "When I got here I had 3 panic attacks, when I get out of this hospital I'm going to sue, how are yall going to leave somebody that's suicidal in a room alone, I get treated like shit." Patient reports that she takes medications but that she gets the medications from her girlfriend and is not prescribed any, when asked why she hasn't gone to Outpatient "I don't have any insurance!" Patient reports that she tried to hurt herself yesterday via cutting, currently she reports feeling like taking out her stitches as a current way of hurting herself. During assessment patient is loud and irritable but not aggressive, her moods switch back and forth to angry and depressed and is tearful at times during the assessment. Patient reports "they just won't shut up" and motions to her head when talking about "they" patient may be experiencing some internal stimuli. Patient UDS is positive for  Tricyclics, Opiates, and Cannabinoids. Patient continues to report SI.    Recommendation: Inpatient Past Psychiatric History: unknown  Risk to Self:   Risk to Others:   Prior Inpatient Therapy:   Prior Outpatient Therapy:    Past Medical History: No past medical history on file.  Past Surgical History:  Procedure Laterality Date   ABDOMINAL HYSTERECTOMY     CHOLECYSTECTOMY     kidney tumor removal     Family History: No family history on file. Family Psychiatric  History: unknown Social History:  Social History   Substance and Sexual Activity  Alcohol Use Yes   Comment: socially     Social History   Substance and Sexual Activity  Drug Use Not Currently    Social History   Socioeconomic History   Marital status: Single    Spouse name: Not on file   Number of children: Not on file   Years of education: Not on file   Highest education level: Not on file  Occupational History   Not on file  Tobacco Use   Smoking status: Every Day    Packs/day: 0.50    Types: Cigarettes   Smokeless tobacco: Never  Substance and Sexual Activity   Alcohol use: Yes    Comment: socially   Drug use: Not Currently   Sexual activity: Not on file  Other Topics Concern   Not on file  Social History Narrative   Not on file   Social Determinants of Health   Financial Resource Strain: Not on file  Food Insecurity: Not on file  Transportation Needs: Not on file  Physical Activity: Not on file  Stress: Not on file  Social Connections: Not on file   Additional Social History:    Allergies:  No Known Allergies  Labs:  Results for orders placed or performed during the hospital encounter of 10/17/21 (from the past 48 hour(s))  Comprehensive metabolic panel     Status: Abnormal   Collection Time: 10/16/21  8:40 PM  Result Value Ref Range   Sodium 142 135 - 145 mmol/L   Potassium 3.5 3.5 - 5.1 mmol/L   Chloride 108 98 - 111 mmol/L   CO2 27 22 - 32 mmol/L   Glucose, Bld 102 (H)  70 - 99 mg/dL    Comment: Glucose reference range applies only to samples taken after fasting for at least 8 hours.   BUN 18 6 - 20 mg/dL   Creatinine, Ser 2.40 0.44 - 1.00 mg/dL   Calcium 9.4 8.9 - 97.3 mg/dL   Total Protein 7.5 6.5 - 8.1 g/dL   Albumin 4.4 3.5 - 5.0 g/dL   AST 21 15 - 41 U/L   ALT 18 0 - 44 U/L   Alkaline Phosphatase 61 38 - 126 U/L   Total Bilirubin 0.5 0.3 - 1.2 mg/dL   GFR, Estimated >53 >29 mL/min    Comment: (NOTE) Calculated using the CKD-EPI Creatinine Equation (2021)    Anion gap 7 5 - 15    Comment: Performed at [facility], [address] [place], [place] [address]  Ethanol     Status: None   Collection Time: 10/16/21  8:40 PM  Result Value Ref Range   Alcohol, Ethyl (B) <10 <10 mg/dL    Comment: (NOTE) Lowest detectable limit for serum alcohol is 10 mg/dL.  For medical purposes only. Performed at [facility], [address] [place], [place] [address]   Salicylate level     Status: Abnormal   Collection Time: 10/16/21  8:40 PM  Result Value Ref Range   Salicylate Lvl <7.0 (L) 7.0 - 30.0 mg/dL    Comment: Performed at [facility], [address] [place], [place] [address]  Acetaminophen level     Status: Abnormal   Collection Time: 10/16/21  8:40 PM  Result Value Ref Range   Acetaminophen (Tylenol), Serum <10 (L) 10 - 30 ug/mL    Comment: (NOTE) Therapeutic concentrations vary significantly. A range of 10-30 ug/mL  may be an effective concentration for many patients. However, some  are best treated at concentrations outside of this range. Acetaminophen concentrations >150 ug/mL at 4 hours after ingestion  and >50 ug/mL at 12 hours after ingestion are often associated with  toxic reactions.  Performed at [facility], [address] [place], [place] [address]   cbc     Status: None   Collection Time: 10/16/21  8:40 PM  Result Value Ref Range   WBC 9.3 4.0 - 10.5 K/uL   RBC 4.71 3.87 - 5.11  MIL/uL   Hemoglobin 14.5 12.0 - 15.0 g/dL   HCT 11.9 41.7 - 40.8 %   MCV 88.1 80.0 - 100.0 fL   MCH 30.8 26.0 - 34.0 pg   MCHC 34.9 30.0 - 36.0 g/dL   RDW 14.4 81.8 - 56.3 %   Platelets 340 150 - 400 K/uL   nRBC 0.0 0.0 - 0.2 %    Comment: Performed at [facility], [address] [place], [place] [address]  Urine Drug Screen, Qualitative  Status: Abnormal   Collection Time: 10/16/21  8:41 PM  Result Value Ref Range   Tricyclic, Ur Screen POSITIVE (A) NONE DETECTED   Amphetamines, Ur Screen NONE DETECTED NONE DETECTED   MDMA (Ecstasy)Ur Screen NONE DETECTED NONE DETECTED   Cocaine Metabolite,Ur [place] NONE DETECTED NONE DETECTED   Opiate, Ur Screen POSITIVE (A) NONE DETECTED   Phencyclidine (PCP) Ur S NONE DETECTED NONE DETECTED   Cannabinoid 50 Ng, Ur [place] POSITIVE (A) NONE DETECTED   Barbiturates, Ur Screen NONE DETECTED NONE DETECTED   Benzodiazepine, Ur Scrn NONE DETECTED NONE DETECTED   Methadone Scn, Ur NONE DETECTED NONE DETECTED    Comment: (NOTE) Tricyclics + metabolites, urine    Cutoff 1000 ng/mL Amphetamines + metabolites, urine  Cutoff 1000 ng/mL MDMA (Ecstasy), urine              Cutoff 500 ng/mL Cocaine Metabolite, urine          Cutoff 300 ng/mL Opiate + metabolites, urine        Cutoff 300 ng/mL Phencyclidine (PCP), urine         Cutoff 25 ng/mL Cannabinoid, urine                 Cutoff 50 ng/mL Barbiturates + metabolites, urine  Cutoff 200 ng/mL Benzodiazepine, urine              Cutoff 200 ng/mL Methadone, urine                   Cutoff 300 ng/mL  The urine drug screen provides only a preliminary, unconfirmed analytical test result and should not be used for non-medical purposes. Clinical consideration and professional judgment should be applied to any positive drug screen result due to possible interfering substances. A more specific alternate chemical method must be used in order to obtain a confirmed analytical result. Gas chromatography / mass  spectrometry (GC/MS) is the preferred confirm atory method. Performed at [facility], [address], [place], [place] [address]     Current Facility-Administered Medications  Medication Dose Route Frequency Provider Last Rate Last Admin   traZODone (DESYREL) tablet 100 mg  100 mg Oral QHS Loleta Rose, MD   100 mg at 10/17/21 0403   Current Outpatient Medications  Medication Sig Dispense Refill   cephALEXin (KEFLEX) 500 MG capsule Take 1 capsule (500 mg total) by mouth 2 (two) times daily. 14 capsule 0   naproxen (NAPROSYN) 500 MG tablet Take 1 tablet (500 mg total) by mouth 2 (two) times daily with a meal. 20 tablet 2   potassium chloride SA (K-DUR,KLOR-CON) 20 MEQ tablet Take 1 tablet (20 mEq total) by mouth 2 (two) times daily. 10 tablet 0   predniSONE (DELTASONE) 50 MG tablet Take 1 tablet (50 mg total) by mouth daily with breakfast. 4 tablet 0   prochlorperazine (COMPAZINE) 10 MG tablet Take 1 tablet (10 mg total) by mouth every 8 (eight) hours as needed for nausea or vomiting. 10 tablet 0   promethazine (PHENERGAN) 25 MG tablet Take 1 tablet (25 mg total) by mouth every 6 (six) hours as needed for nausea or vomiting. 10 tablet 0    Musculoskeletal: Strength & Muscle Tone: within normal limits Gait & Station: normal Patient leans: N/A            Psychiatric Specialty Exam:  Presentation  General Appearance: Bizarre  Eye Contact:Fair  Speech:Pressured  Speech Volume:Normal  Handedness:Right   Mood and Affect  Mood:Anxious; Depressed; Angry  Affect:Congruent; Depressed; Labile; Tearful   Thought Process  Thought Processes:Coherent  Descriptions of Associations:Intact  Orientation:Full (Time, Place and Person)  Thought Content:Logical  History of Schizophrenia/Schizoaffective disorder:No  Duration of Psychotic Symptoms:N/A  Hallucinations:Hallucinations: None  Ideas of Reference:None  Suicidal Thoughts:Suicidal Thoughts: Yes,  Passive  Homicidal Thoughts:Homicidal Thoughts: No   Sensorium  Memory:Immediate Fair; Remote Fair  Judgment:Fair  Insight:Poor   Executive Functions  Concentration:Poor  Attention Span:Fair  Recall:Fair  Fund of Knowledge:Fair  Language:Fair   Psychomotor Activity  Psychomotor Activity:Psychomotor Activity: Normal   Assets  Assets:Desire for Improvement; Housing; Vocational/Educational   Sleep  Sleep:Sleep: Poor   Physical Exam: Physical Exam Vitals and nursing note reviewed.  Constitutional:      Appearance: Normal appearance.  HENT:     Head: Normocephalic and atraumatic.     Nose: Nose normal.     Mouth/Throat:     Mouth: Mucous membranes are moist.  Eyes:     Pupils: Pupils are equal, round, and reactive to light.  Cardiovascular:     Rate and Rhythm: Normal rate and regular rhythm.  Pulmonary:     Effort: Pulmonary effort is normal.  Musculoskeletal:        General: Normal range of motion.     Cervical back: Normal range of motion.  Skin:    General: Skin is warm and dry.  Neurological:     General: No focal deficit present.     Mental Status: She is alert and oriented to person, place, and time.  Psychiatric:        Attention and Perception: Attention and perception normal.        Mood and Affect: Mood normal.        Speech: Speech normal.        Behavior: Behavior normal. Behavior is cooperative.        Thought Content: Thought content normal.        Cognition and Memory: Cognition and memory normal.        Judgment: Judgment normal.   Review of Systems  All other systems reviewed and are negative. Blood pressure (!) 130/99, pulse 100, temperature 98.6 F (37 C), temperature source Oral, resp. rate 18, height 5\' 5"  (1.651 m), weight 99 kg, SpO2 98 %. Body mass index is 36.32 kg/m.  Treatment Plan Summary: Daily contact with patient to assess and evaluate symptoms and progress in treatment and Medication management  Disposition:  Recommend psychiatric Inpatient admission when medically cleared. Supportive therapy provided about ongoing stressors. Discussed crisis plan, support from social network, calling 911, coming to the Emergency Department, and calling Suicide Hotline.  Jearld Leschashaun M Corrissa Martello, NP 10/17/2021 6:24 AM

## 2021-10-17 NOTE — Progress Notes (Signed)
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 Patient is alert and oriented to unit. Patient is cooperative during assessment questions. Patient presents with a stitched laceration on the left forearm. Patient states that she gave herself the laceration on Thursday 1/19 after an argument with partner. Patient reports pain of 7/10 for pain at the site of laceration. Patient states that she used cutting as a coping mechanism in the past and they do not remember giving herself the current laceration. Patient identifies stressors as moving away from family to live with partner. Patient states that due to move she had to quit her job and is currently unemployed. After the move patient states that they no longer have a vehicle.   Patient currently denies SI/HI/AVH. Unit policies explained and verbalized understanding. Q15 minute checks maintained and will continue to monitor.

## 2021-10-17 NOTE — ED Notes (Signed)
Pt was banging her head against the wall when RN told her to stop, pt replied "non of yall care about me" pt growlingly getting aggregative going on saying no one is doing anything for her and she is hearing voices that are trying to make her do things, When RN asked to elaborate she did not give an answer.

## 2021-10-18 ENCOUNTER — Encounter: Payer: Self-pay | Admitting: Psychiatry

## 2021-10-18 DIAGNOSIS — F332 Major depressive disorder, recurrent severe without psychotic features: Principal | ICD-10-CM

## 2021-10-18 MED ORDER — QUETIAPINE FUMARATE 200 MG PO TABS
400.0000 mg | ORAL_TABLET | Freq: Every day | ORAL | Status: DC
  Administered 2021-10-18 – 2021-10-19 (×2): 400 mg via ORAL
  Filled 2021-10-18 (×2): qty 2

## 2021-10-18 MED ORDER — DULOXETINE HCL 30 MG PO CPEP
30.0000 mg | ORAL_CAPSULE | Freq: Every day | ORAL | Status: DC
  Administered 2021-10-18 – 2021-10-20 (×3): 30 mg via ORAL
  Filled 2021-10-18 (×3): qty 1

## 2021-10-18 NOTE — Plan of Care (Signed)
°  Problem: Safety: Goal: Violent Restraint(s) Outcome: Progressing   Problem: Education: Goal: Knowledge of [facility] Education information/materials will improve Outcome: Progressing Goal: Emotional status will improve Outcome: Progressing Goal: Mental status will improve Outcome: Progressing Goal: Verbalization of understanding the information provided will improve Outcome: Progressing   Problem: Activity: Goal: Interest or engagement in activities will improve Outcome: Progressing Goal: Sleeping patterns will improve Outcome: Progressing   Problem: Coping: Goal: Ability to verbalize frustrations and anger appropriately will improve Outcome: Progressing Goal: Ability to demonstrate self-control will improve Outcome: Progressing   Problem: Health Behavior/Discharge Planning: Goal: Identification of resources available to assist in meeting health care needs will improve Outcome: Progressing Goal: Compliance with treatment plan for underlying cause of condition will improve Outcome: Progressing   Problem: Physical Regulation: Goal: Ability to maintain clinical measurements within normal limits will improve Outcome: Progressing   Problem: Safety: Goal: Periods of time without injury will increase Outcome: Progressing   Problem: Education: Goal: Ability to state activities that reduce stress will improve Outcome: Progressing   Problem: Coping: Goal: Ability to identify and develop effective coping behavior will improve Outcome: Progressing   Problem: Self-Concept: Goal: Ability to identify factors that promote anxiety will improve Outcome: Progressing Goal: Level of anxiety will decrease Outcome: Progressing Goal: Ability to modify response to factors that promote anxiety will improve Outcome: Progressing   Problem: Education: Goal: Utilization of techniques to improve thought processes will improve Outcome: Progressing Goal: Knowledge of the  prescribed therapeutic regimen will improve Outcome: Progressing   Problem: Activity: Goal: Interest or engagement in leisure activities will improve Outcome: Progressing Goal: Imbalance in normal sleep/wake cycle will improve Outcome: Progressing   Problem: Coping: Goal: Coping ability will improve Outcome: Progressing Goal: Will verbalize feelings Outcome: Progressing   Problem: Health Behavior/Discharge Planning: Goal: Ability to make decisions will improve Outcome: Progressing Goal: Compliance with therapeutic regimen will improve Outcome: Progressing   Problem: Role Relationship: Goal: Will demonstrate positive changes in social behaviors and relationships Outcome: Progressing   Problem: Safety: Goal: Ability to disclose and discuss suicidal ideas will improve Outcome: Progressing Goal: Ability to identify and utilize support systems that promote safety will improve Outcome: Progressing   Problem: Self-Concept: Goal: Will verbalize positive feelings about self Outcome: Progressing Goal: Level of anxiety will decrease Outcome: Progressing

## 2021-10-18 NOTE — Progress Notes (Signed)
Patient alert and oriented. Patient denies pain. Patient rates anxiety 8/10. Patient denies depression, SI, HI, and AVH. Patient cooperative during assessment. Patient compliant with medication administration. Patient seen appropriately interacting with peers around the unit. Q15 minute safety checks maintained. Patient remains safe on the unit at this time.
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
## 2021-10-18 NOTE — Group Note (Signed)
LCSW Group Therapy Note  Group Date: 10/18/2021 Start Time: 1300 End Time: 1345   Type of Therapy and Topic:  Group Therapy - How To Cope with Nervousness about Discharge   Participation Level:  Active   Description of Group This process group involved identification of patients' feelings about discharge. Some of them are scheduled to be discharged soon, while others are new admissions, but each of them was asked to share thoughts and feelings surrounding discharge from the hospital. One common theme was that they are excited at the prospect of going home, while another was that many of them are apprehensive about sharing why they were hospitalized. Patients were given the opportunity to discuss these feelings with their peers in preparation for discharge.  Therapeutic Goals  Patient will identify their overall feelings about pending discharge. Patient will think about how they might proactively address issues that they believe will once again arise once they get home (i.e. with parents). Patients will participate in discussion about having hope for change.   Summary of Patient Progress: Patient was present for the entirety of the group session. Patient was an active listener and participated in the topic of discussion. Patient states she presented to the hospital due to feeling stressed about her financial situation and her relationship. Patient states she continues to feel anxious. At discharge, patient states she plans to stay with her mom and start seeing an outpatient therapist along with medication management.     Therapeutic Modalities Cognitive Behavioral Therapy   Marletta Lor 10/18/2021  2:52 PM
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
## 2021-10-18 NOTE — [facility] Suicide Risk Assessment (Signed)
[facility] INPATIENT:  Family/Significant Other Suicide Prevention Education  Suicide Prevention Education:  Education Completed; Burnard Hawthorne (mother) [phone], has been identified by the patient as the family member/significant other with whom the patient will be residing, and identified as the person(s) who will aid the patient in the event of a mental health crisis (suicidal ideations/suicide attempt).  With written consent from the patient, the family member/significant other has been provided the following suicide prevention education, prior to the and/or following the discharge of the patient.  The suicide prevention education provided includes the following: Suicide risk factors Suicide prevention and interventions National Suicide Hotline telephone number [facility] assessment telephone number [facility] Emergency Assistance 911 [facility] and/or Residential Mobile Crisis Unit telephone number  Request made of family/significant other to: Remove weapons (e.g., guns, rifles, knives), all items previously/currently identified as safety concern. Remove drugs/medications (over-the-counter, prescriptions, illicit drugs), all items previously/currently identified as a safety concern.  The family member/significant other verbalizes understanding of the suicide prevention education information provided.  The family member/significant other agrees to remove the items of safety concern listed above.  Patient's mother confirms current plant is for patient to stay with mother in [place], [place]. Mother is supportive of patient.   Patient's mother states she has a shotgun in her home. Mother agreed to secure firearm in a locked location prior to patient being discharged.  Patient's mother states patient has voiced vague suicidal ideation in the past, stating "it would be better off if I wasn't here" after being put in stressful situations but denies a known history of previous  suicide attempts. No family history of suicide attempts.   Ileana Ladd Marea Reasner 10/18/2021, 4:29 PM

## 2021-10-18 NOTE — Progress Notes (Signed)
[facility] LCSW Progress Note   10/18/2021    10:43 AM  Hannah Reid   [id number]   Type of Contact and Topic:  Release of Information  .Hannah Reid, patient, provided written consent for CSW team to coordinate aftercare at this time. CSW to have further conversations with patient regarding care in the community.   Patient is planning on moving back to [place], [place] ([place] Interested in following up with outpatient mental health services there.   Signed:  Corky Crafts, MSW, [place], LCASA 10/18/2021 10:43 AM

## 2021-10-18 NOTE — Progress Notes (Signed)
Patient denies SI and HI and says she is here to get her medications adjusted. She reports being on zoloft before and said that it made her feel suicidal. She said she was on cymbalta before and felt better with that. Contracts for safety

## 2021-10-19 ENCOUNTER — Other Ambulatory Visit: Payer: Self-pay

## 2021-10-19 DIAGNOSIS — I10 Essential (primary) hypertension: Secondary | ICD-10-CM

## 2021-10-19 LAB — LIPID PANEL
Cholesterol: 260 mg/dL — ABNORMAL HIGH (ref 0–200)
HDL: 42 mg/dL (ref >40)
LDL Cholesterol: 170 mg/dL — ABNORMAL HIGH (ref 0–99)
Total CHOL/HDL Ratio: 6.2 RATIO
Triglycerides: 241 mg/dL — ABNORMAL HIGH (ref <150)
VLDL: 48 mg/dL — ABNORMAL HIGH (ref 0–40)

## 2021-10-19 MED ORDER — DULOXETINE HCL 30 MG PO CPEP
30.0000 mg | ORAL_CAPSULE | Freq: Every day | ORAL | 0 refills | Status: DC
  Filled 2021-10-19: qty 10, 10d supply, fill #0

## 2021-10-19 MED ORDER — LISINOPRIL 5 MG PO TABS
5.0000 mg | ORAL_TABLET | Freq: Every day | ORAL | Status: DC
  Administered 2021-10-19 – 2021-10-20 (×2): 5 mg via ORAL
  Filled 2021-10-19 (×2): qty 1

## 2021-10-19 MED ORDER — LISINOPRIL 5 MG PO TABS
5.0000 mg | ORAL_TABLET | Freq: Every day | ORAL | 0 refills | Status: DC
  Filled 2021-10-19: qty 10, 10d supply, fill #0

## 2021-10-19 MED ORDER — QUETIAPINE FUMARATE 200 MG PO TABS
400.0000 mg | ORAL_TABLET | Freq: Every day | ORAL | 0 refills | Status: DC
  Filled 2021-10-19: qty 20, 10d supply, fill #0

## 2021-10-19 MED ORDER — GABAPENTIN 100 MG PO CAPS
100.0000 mg | ORAL_CAPSULE | Freq: Three times a day (TID) | ORAL | 0 refills | Status: DC
  Filled 2021-10-19: qty 30, 10d supply, fill #0

## 2021-10-19 MED ORDER — HYDROXYZINE HCL 50 MG PO TABS
50.0000 mg | ORAL_TABLET | Freq: Four times a day (QID) | ORAL | Status: DC | PRN
  Administered 2021-10-19: 50 mg via ORAL
  Filled 2021-10-19: qty 1

## 2021-10-19 NOTE — Progress Notes (Signed)
D Patient alert and oriented x 3 Denies SI/HI/A/VH. Interacting well with Peers and Staff  A Scheduled medications administered per Provider order. Support and encouragement provided. Routine safety checks conducted every 15 minutes. Patient notified to inform staff with problems or concerns.  R. No adverse drug reactions noted. Patient contracts for safety at this time.

## 2021-10-19 NOTE — Plan of Care (Signed)
°  Problem: Safety: Goal: Violent Restraint(s) Outcome: Progressing   Problem: Education: Goal: Knowledge of [facility] Education information/materials will improve Outcome: Progressing Goal: Emotional status will improve Outcome: Progressing Goal: Mental status will improve Outcome: Progressing Goal: Verbalization of understanding the information provided will improve Outcome: Progressing   Problem: Activity: Goal: Interest or engagement in activities will improve Outcome: Progressing Goal: Sleeping patterns will improve Outcome: Progressing   Problem: Coping: Goal: Ability to verbalize frustrations and anger appropriately will improve Outcome: Progressing Goal: Ability to demonstrate self-control will improve Outcome: Progressing   Problem: Health Behavior/Discharge Planning: Goal: Identification of resources available to assist in meeting health care needs will improve Outcome: Progressing Goal: Compliance with treatment plan for underlying cause of condition will improve Outcome: Progressing   Problem: Physical Regulation: Goal: Ability to maintain clinical measurements within normal limits will improve Outcome: Progressing   Problem: Safety: Goal: Periods of time without injury will increase Outcome: Progressing   Problem: Education: Goal: Ability to state activities that reduce stress will improve Outcome: Progressing   Problem: Coping: Goal: Ability to identify and develop effective coping behavior will improve Outcome: Progressing   Problem: Self-Concept: Goal: Ability to identify factors that promote anxiety will improve Outcome: Progressing Goal: Level of anxiety will decrease Outcome: Progressing Goal: Ability to modify response to factors that promote anxiety will improve Outcome: Progressing   Problem: Education: Goal: Utilization of techniques to improve thought processes will improve Outcome: Progressing Goal: Knowledge of the  prescribed therapeutic regimen will improve Outcome: Progressing   Problem: Activity: Goal: Interest or engagement in leisure activities will improve Outcome: Progressing Goal: Imbalance in normal sleep/wake cycle will improve Outcome: Progressing   Problem: Coping: Goal: Coping ability will improve Outcome: Progressing Goal: Will verbalize feelings Outcome: Progressing   Problem: Health Behavior/Discharge Planning: Goal: Ability to make decisions will improve Outcome: Progressing Goal: Compliance with therapeutic regimen will improve Outcome: Progressing   Problem: Role Relationship: Goal: Will demonstrate positive changes in social behaviors and relationships Outcome: Progressing   Problem: Safety: Goal: Ability to disclose and discuss suicidal ideas will improve Outcome: Progressing Goal: Ability to identify and utilize support systems that promote safety will improve Outcome: Progressing   Problem: Self-Concept: Goal: Will verbalize positive feelings about self Outcome: Progressing Goal: Level of anxiety will decrease Outcome: Progressing

## 2021-10-19 NOTE — Group Note (Cosign Needed)
[facility] LCSW Group Therapy Note    Group Date: 10/19/2021 Start Time: 0100 End Time: 0200  Type of Therapy and Topic:  Group Therapy:  Overcoming Obstacles  Participation Level:  [facility] PARTICIPATION LEVEL: Did Not Attend   Description of Group:   In this group patients will be encouraged to explore what they see as obstacles to their own wellness and recovery. They will be guided to discuss their thoughts, feelings, and behaviors related to these obstacles. The group will process together ways to cope with barriers, with attention given to specific choices patients can make. Each patient will be challenged to identify changes they are motivated to make in order to overcome their obstacles. This group will be process-oriented, with patients participating in exploration of their own experiences as well as giving and receiving support and challenge from other group members.  Therapeutic Goals: 1. Patient will identify personal and current obstacles as they relate to admission. 2. Patient will identify barriers that currently interfere with their wellness or overcoming obstacles.  3. Patient will identify feelings, thought process and behaviors related to these barriers. 4. Patient will identify two changes they are willing to make to overcome these obstacles:    Summary of Patient Progress   X   Therapeutic Modalities:   Cognitive Behavioral Therapy Solution Focused Therapy Motivational Interviewing Relapse Prevention Therapy   Jarrett Ables, Student-Social Work

## 2021-10-19 NOTE — Progress Notes (Signed)
[facility] MD Progress Note  10/19/2021 3:24 PM Hannah Reid  MRN:  [id number] Subjective: Follow-up for this patient with depression and anxiety and recent cut to her arm.  Patient today says she is feeling more normal.  She denies any current thoughts of suicide or wanting to hurt herself.  She is able to talk about the voices that she hears at times when she is emotionally overwhelmed.  Sounds very much like borderline or PTSD kind of hallucinations.  Patient is calm today.  Came to treatment team and interacted appropriately.  Requesting discharge soon.  Tolerating medicine.  Did have high blood pressure noticed today and anxiety in the afternoon Principal Problem: Severe episode of recurrent major depressive disorder, without psychotic features (HCC) Diagnosis: Principal Problem:   Severe episode of recurrent major depressive disorder, without psychotic features (HCC) Active Problems:   Suicidal ideation   High blood pressure  Total Time spent with patient: 30 minutes  Past Psychiatric History: Past history of depression and anxiety  Past Medical History: History reviewed. No pertinent past medical history.  Past Surgical History:  Procedure Laterality Date   ABDOMINAL HYSTERECTOMY     CHOLECYSTECTOMY     kidney tumor removal     Family History: History reviewed. No pertinent family history. Family Psychiatric  History: See previous Social History:  Social History   Substance and Sexual Activity  Alcohol Use Yes   Comment: socially     Social History   Substance and Sexual Activity  Drug Use Not Currently    Social History   Socioeconomic History   Marital status: Single    Spouse name: Not on file   Number of children: Not on file   Years of education: Not on file   Highest education level: Not on file  Occupational History   Not on file  Tobacco Use   Smoking status: Every Day    Packs/day: 0.50    Types: Cigarettes   Smokeless tobacco: Never  Vaping Use    Vaping Use: Some days  Substance and Sexual Activity   Alcohol use: Yes    Comment: socially   Drug use: Not Currently   Sexual activity: Not on file  Other Topics Concern   Not on file  Social History Narrative   Not on file   Social Determinants of Health   Financial Resource Strain: Not on file  Food Insecurity: Not on file  Transportation Needs: Not on file  Physical Activity: Not on file  Stress: Not on file  Social Connections: Not on file   Additional Social History:                         Sleep: Fair  Appetite:  Fair  Current Medications: Current Facility-Administered Medications  Medication Dose Route Frequency Provider Last Rate Last Admin   acetaminophen (TYLENOL) tablet 650 mg  650 mg Oral Q6H PRN Charm Rings, NP       alum & mag hydroxide-simeth (MAALOX/MYLANTA) 200-200-20 MG/5ML suspension 30 mL  30 mL Oral Q4H PRN Charm Rings, NP       DULoxetine (CYMBALTA) DR capsule 30 mg  30 mg Oral Daily Sarina Ill, DO   30 mg at 10/19/21 0816   gabapentin (NEURONTIN) capsule 100 mg  100 mg Oral TID Charm Rings, NP   100 mg at 10/19/21 1202   hydrOXYzine (ATARAX) tablet 50 mg  50 mg Oral Q6H PRN Maribell Demeo, Jackquline Denmark,  MD   50 mg at 10/19/21 1349   lisinopril (ZESTRIL) tablet 5 mg  5 mg Oral Daily Mylisa Brunson T, MD   5 mg at 10/19/21 1349   magnesium hydroxide (MILK OF MAGNESIA) suspension 30 mL  30 mL Oral Daily PRN Charm RingsLord, Jamison Y, NP       QUEtiapine (SEROQUEL) tablet 400 mg  400 mg Oral QHS Sarina IllHerrick, Richard Edward, DO   400 mg at 10/18/21 2124    Lab Results: No results found for this or any previous visit (from the past 48 hour(s)).  Blood Alcohol level:  Lab Results  Component Value Date   ETH <10 10/16/2021    Metabolic Disorder Labs: No results found for: HGBA1C, MPG No results found for: PROLACTIN No results found for: CHOL, TRIG, HDL, CHOLHDL, VLDL, LDLCALC  Physical Findings: AIMS:  , ,  ,  ,    CIWA:    COWS:      Musculoskeletal: Strength & Muscle Tone: within normal limits Gait & Station: normal Patient leans: N/A  Psychiatric Specialty Exam:  Presentation  General Appearance: Bizarre  Eye Contact:Fair  Speech:Pressured  Speech Volume:Normal  Handedness:Right   Mood and Affect  Mood:Anxious; Depressed; Angry  Affect:Congruent; Depressed; Labile; Tearful   Thought Process  Thought Processes:Coherent  Descriptions of Associations:Intact  Orientation:Full (Time, Place and Person)  Thought Content:Logical  History of Schizophrenia/Schizoaffective disorder:No  Duration of Psychotic Symptoms:N/A  Hallucinations:No data recorded Ideas of Reference:None  Suicidal Thoughts:No data recorded Homicidal Thoughts:No data recorded  Sensorium  Memory:Immediate Fair; Remote Fair  Judgment:Fair  Insight:Poor   Executive Functions  Concentration:Poor  Attention Span:Fair  Recall:Fair  Fund of Knowledge:Fair  Language:Fair   Psychomotor Activity  Psychomotor Activity:No data recorded  Assets  Assets:Desire for Improvement; Housing; Vocational/Educational   Sleep  Sleep:No data recorded   Physical Exam: Physical Exam Vitals and nursing note reviewed.  Constitutional:      Appearance: Normal appearance.  HENT:     Head: Normocephalic and atraumatic.     Mouth/Throat:     Pharynx: Oropharynx is clear.  Eyes:     Pupils: Pupils are equal, round, and reactive to light.  Cardiovascular:     Rate and Rhythm: Normal rate and regular rhythm.  Pulmonary:     Effort: Pulmonary effort is normal.     Breath sounds: Normal breath sounds.  Abdominal:     General: Abdomen is flat.     Palpations: Abdomen is soft.  Musculoskeletal:        General: Normal range of motion.  Skin:    General: Skin is warm and dry.  Neurological:     General: No focal deficit present.     Mental Status: She is alert. Mental status is at baseline.  Psychiatric:        Attention  and Perception: Attention normal.        Mood and Affect: Mood is anxious.        Speech: Speech normal.        Behavior: Behavior normal.        Thought Content: Thought content normal.        Cognition and Memory: Cognition normal.   Review of Systems  Constitutional: Negative.   HENT: Negative.    Eyes: Negative.   Respiratory: Negative.    Cardiovascular: Negative.   Gastrointestinal: Negative.   Musculoskeletal: Negative.   Skin: Negative.   Neurological: Negative.   Psychiatric/Behavioral:  Positive for depression and hallucinations. Negative for substance abuse and  suicidal ideas. The patient is nervous/anxious.   Blood pressure (!) 156/107, pulse 67, temperature 97.6 F (36.4 C), temperature source Oral, resp. rate 18, height 5\' 5"  (1.651 m), weight 105.7 kg, SpO2 100 %. Body mass index is 38.77 kg/m.   Treatment Plan Summary: Medication management and Plan no change to current medication.  Tolerating current combination of Seroquel and Cymbalta well.  Patient is requesting discharge which we will tentatively plan for tomorrow.  She will need outpatient follow-up at discharge but is planning to return to [facility].  [facility], MD 10/19/2021, 3:24 PM

## 2021-10-19 NOTE — Progress Notes (Signed)
Patient complained of nausea and was given ginger ale. Medication compliant. Denies SI, HI and AVH and voiced no more complaints

## 2021-10-19 NOTE — Progress Notes (Signed)
Patient was hopeful this morning about going home tomorrow and getting outpatient provider. But this afternoon patient came to know that she got charges for assaulting her friend.Patient anxious and tearful about this. Patient verbalized her feelings with staff. PRN medications given.Denies SI,HI and AVH. Appetite and energy level good. Support and encouragement given.

## 2021-10-19 NOTE — BH IP Treatment Plan (Signed)
Interdisciplinary Treatment and Diagnostic Plan Update  10/19/2021 Time of Session: 9:30 AM Hannah Reid MRN: [id number]  Principal Diagnosis: Severe episode of recurrent major depressive disorder, without psychotic features ([place])  Secondary Diagnoses: Principal Problem:   Severe episode of recurrent major depressive disorder, without psychotic features ([place])   Current Medications:  Current Facility-Administered Medications  Medication Dose Route Frequency Provider Last Rate Last Admin   acetaminophen (TYLENOL) tablet 650 mg  650 mg Oral Q6H PRN Patrecia Pour, NP       alum & mag hydroxide-simeth (MAALOX/MYLANTA) 200-200-20 MG/5ML suspension 30 mL  30 mL Oral Q4H PRN Patrecia Pour, NP       DULoxetine (CYMBALTA) DR capsule 30 mg  30 mg Oral Daily Parks Ranger, DO   30 mg at 10/19/21 0816   gabapentin (NEURONTIN) capsule 100 mg  100 mg Oral TID Patrecia Pour, NP   100 mg at 10/19/21 0816   magnesium hydroxide (MILK OF MAGNESIA) suspension 30 mL  30 mL Oral Daily PRN Patrecia Pour, NP       QUEtiapine (SEROQUEL) tablet 400 mg  400 mg Oral QHS Parks Ranger, DO   400 mg at 10/18/21 2124   PTA Medications: Medications Prior to Admission  Medication Sig Dispense Refill Last Dose   cephALEXin (KEFLEX) 500 MG capsule Take 1 capsule (500 mg total) by mouth 2 (two) times daily. (Patient not taking: Reported on 10/17/2021) 14 capsule 0    naproxen (NAPROSYN) 500 MG tablet Take 1 tablet (500 mg total) by mouth 2 (two) times daily with a meal. (Patient not taking: Reported on 10/17/2021) 20 tablet 2    potassium chloride SA (K-DUR,KLOR-CON) 20 MEQ tablet Take 1 tablet (20 mEq total) by mouth 2 (two) times daily. (Patient not taking: Reported on 10/17/2021) 10 tablet 0    predniSONE (DELTASONE) 50 MG tablet Take 1 tablet (50 mg total) by mouth daily with breakfast. (Patient not taking: Reported on 10/17/2021) 4 tablet 0    prochlorperazine (COMPAZINE) 10 MG tablet Take 1  tablet (10 mg total) by mouth every 8 (eight) hours as needed for nausea or vomiting. (Patient not taking: Reported on 10/17/2021) 10 tablet 0    promethazine (PHENERGAN) 25 MG tablet Take 1 tablet (25 mg total) by mouth every 6 (six) hours as needed for nausea or vomiting. (Patient not taking: Reported on 10/17/2021) 10 tablet 0     Patient Stressors: Financial difficulties   Loss of employment    Patient Strengths: Motivation for treatment/growth   Treatment Modalities: Medication Management, Group therapy, Case management,  1 to 1 session with clinician, Psychoeducation, Recreational therapy.   Physician Treatment Plan for Primary Diagnosis: Severe episode of recurrent major depressive disorder, without psychotic features ([place]) Long Term Goal(s): Improvement in symptoms so as ready for discharge   Short Term Goals: Ability to identify changes in lifestyle to reduce recurrence of condition will improve Ability to verbalize feelings will improve Ability to disclose and discuss suicidal ideas Ability to demonstrate self-control will improve Ability to identify and develop effective coping behaviors will improve Ability to maintain clinical measurements within normal limits will improve Compliance with prescribed medications will improve Ability to identify triggers associated with substance abuse/mental health issues will improve  Medication Management: Evaluate patient's response, side effects, and tolerance of medication regimen.  Therapeutic Interventions: 1 to 1 sessions, Unit Group sessions and Medication administration.  Evaluation of Outcomes: Not Met  Physician Treatment Plan for Secondary Diagnosis: Principal  Problem:   Severe episode of recurrent major depressive disorder, without psychotic features ([place])  Long Term Goal(s): Improvement in symptoms so as ready for discharge   Short Term Goals: Ability to identify changes in lifestyle to reduce recurrence of condition will  improve Ability to verbalize feelings will improve Ability to disclose and discuss suicidal ideas Ability to demonstrate self-control will improve Ability to identify and develop effective coping behaviors will improve Ability to maintain clinical measurements within normal limits will improve Compliance with prescribed medications will improve Ability to identify triggers associated with substance abuse/mental health issues will improve     Medication Management: Evaluate patient's response, side effects, and tolerance of medication regimen.  Therapeutic Interventions: 1 to 1 sessions, Unit Group sessions and Medication administration.  Evaluation of Outcomes: Not Met   RN Treatment Plan for Primary Diagnosis: Severe episode of recurrent major depressive disorder, without psychotic features ([place]) Long Term Goal(s): Knowledge of disease and therapeutic regimen to maintain health will improve  Short Term Goals: Ability to demonstrate self-control, Ability to participate in decision making will improve, Ability to verbalize feelings will improve, Ability to disclose and discuss suicidal ideas, Ability to identify and develop effective coping behaviors will improve, and Compliance with prescribed medications will improve  Medication Management: RN will administer medications as ordered by provider, will assess and evaluate patient's response and provide education to patient for prescribed medication. RN will report any adverse and/or side effects to prescribing provider.  Therapeutic Interventions: 1 on 1 counseling sessions, Psychoeducation, Medication administration, Evaluate responses to treatment, Monitor vital signs and CBGs as ordered, Perform/monitor CIWA, COWS, AIMS and Fall Risk screenings as ordered, Perform wound care treatments as ordered.  Evaluation of Outcomes: Not Met   LCSW Treatment Plan for Primary Diagnosis: Severe episode of recurrent major depressive disorder, without  psychotic features ([place]) Long Term Goal(s): Safe transition to appropriate next level of care at discharge, Engage patient in therapeutic group addressing interpersonal concerns.  Short Term Goals: Engage patient in aftercare planning with referrals and resources, Increase social support, Increase ability to appropriately verbalize feelings, Increase emotional regulation, Facilitate acceptance of mental health diagnosis and concerns, and Increase skills for wellness and recovery  Therapeutic Interventions: Assess for all discharge needs, 1 to 1 time with Social worker, Explore available resources and support systems, Assess for adequacy in community support network, Educate family and significant other(s) on suicide prevention, Complete Psychosocial Assessment, Interpersonal group therapy.  Evaluation of Outcomes: Not Met   Progress in Treatment: Attending groups: Yes. Participating in groups: Yes. Taking medication as prescribed: Yes. Toleration medication: Yes. Family/Significant other contact made: Yes, individual(s) contacted:  SPE completed with the patient's mother.  Patient understands diagnosis: Yes. Discussing patient identified problems/goals with staff: Yes. Medical problems stabilized or resolved: Yes. Denies suicidal/homicidal ideation: Yes. Issues/concerns per patient self-inventory: No. Other: none  New problem(s) identified: No, Describe:  none  New Short Term/Long Term Goal(s): medication management for mood stabilization; elimination of SI thoughts; development of comprehensive mental wellness/sobriety plan.   Patient Goals:  "I just wanted outpatient help"  Discharge Plan or Barriers: Pt reports that she will move to [facility] at discharge and live with her mother.  She reports that she is seeking outpatient therapy.  CSW will assist.   Reason for Continuation of Hospitalization: Anxiety Depression Medication stabilization Suicidal ideation  Estimated Length  of Stay:  1-7 days   Scribe for Treatment Team: Rozann Lesches, LCSW 10/19/2021 11:17 AM

## 2021-10-20 ENCOUNTER — Other Ambulatory Visit: Payer: Self-pay

## 2021-10-20 LAB — HEMOGLOBIN A1C
Hgb A1c MFr Bld: 5.1 % (ref 4.8–5.6)
Mean Plasma Glucose: 100 mg/dL

## 2021-10-20 MED ORDER — QUETIAPINE FUMARATE 400 MG PO TABS: 400.0000 mg | ORAL_TABLET | Freq: Every day | ORAL | 1 refills | Status: DC

## 2021-10-20 MED ORDER — DULOXETINE HCL 30 MG PO CPEP: 30.0000 mg | ORAL_CAPSULE | Freq: Every day | ORAL | 1 refills | Status: DC

## 2021-10-20 MED ORDER — GABAPENTIN 100 MG PO CAPS: 100.0000 mg | ORAL_CAPSULE | Freq: Three times a day (TID) | ORAL | 1 refills | Status: DC

## 2021-10-20 MED ORDER — LISINOPRIL 5 MG PO TABS: 5.0000 mg | ORAL_TABLET | Freq: Every day | ORAL | 1 refills | Status: DC

## 2021-10-20 NOTE — Group Note (Cosign Needed)
[facility] LCSW Group Therapy Note   Group Date: 10/20/2021 Start Time: 0130 End Time: 0215  Type of Therapy/Topic:  Group Therapy:  Feelings about Diagnosis  Participation Level:  None     Description of Group:    This group will allow patients to explore their thoughts and feelings about diagnoses they have received. Patients will be guided to explore their level of understanding and acceptance of these diagnoses. Facilitator will encourage patients to process their thoughts and feelings about the reactions of others to their diagnosis, and will guide patients in identifying ways to discuss their diagnosis with significant others in their lives. This group will be process-oriented, with patients participating in exploration of their own experiences as well as giving and receiving support and challenge from other group members.   Therapeutic Goals: 1. Patient will demonstrate understanding of diagnosis as evidence by identifying two or more symptoms of the disorder:  2. Patient will be able to express two feelings regarding the diagnosis 3. Patient will demonstrate ability to communicate their needs through discussion and/or role plays  Summary of Patient Progress:  Patient walked in the last minute of the group, and she did not participate.      Therapeutic Modalities:   Cognitive Behavioral Therapy Brief Therapy Feelings Identification    Starleen Arms, Student-Social Work

## 2021-10-20 NOTE — Progress Notes (Signed)
Patient ID: Hannah Reid, female   DOB: 1981-05-28, 41 y.o.   MRN: [id number] Patient denies SI/HI/AVH. Belongings were returned to patient. Discharge instructions  including medication and follow up information were reviewed with patient and understanding was verbalized. Patient was not observed to be in distress at time of discharge. Patient was escorted out with staff to be transported by safe transport.

## 2021-10-20 NOTE — BHH Suicide Risk Assessment (Signed)
[facility] Discharge Suicide Risk Assessment   Principal Problem: Severe episode of recurrent major depressive disorder, without psychotic features (HCC) Discharge Diagnoses: Principal Problem:   Severe episode of recurrent major depressive disorder, without psychotic features (HCC) Active Problems:   Suicidal ideation   High blood pressure   Total Time spent with patient: 45 minutes  Musculoskeletal: Strength & Muscle Tone: within normal limits Gait & Station: normal Patient leans: N/A  Psychiatric Specialty Exam  Presentation  General Appearance: Bizarre  Eye Contact:Fair  Speech:Pressured  Speech Volume:Normal  Handedness:Right   Mood and Affect  Mood:Anxious; Depressed; Angry  Duration of Depression Symptoms: Greater than two weeks  Affect:Congruent; Depressed; Labile; Tearful   Thought Process  Thought Processes:Coherent  Descriptions of Associations:Intact  Orientation:Full (Time, Place and Person)  Thought Content:Logical  History of Schizophrenia/Schizoaffective disorder:No  Duration of Psychotic Symptoms:N/A  Hallucinations:No data recorded Ideas of Reference:None  Suicidal Thoughts:No data recorded Homicidal Thoughts:No data recorded  Sensorium  Memory:Immediate Fair; Remote Fair  Judgment:Fair  Insight:Poor   Executive Functions  Concentration:Poor  Attention Span:Fair  Recall:Fair  Fund of Knowledge:Fair  Language:Fair   Psychomotor Activity  Psychomotor Activity:No data recorded  Assets  Assets:Desire for Improvement; Housing; Vocational/Educational   Sleep  Sleep:No data recorded  Physical Exam: Physical Exam Vitals and nursing note reviewed.  Constitutional:      Appearance: Normal appearance.  HENT:     Head: Normocephalic and atraumatic.     Mouth/Throat:     Pharynx: Oropharynx is clear.  Eyes:     Pupils: Pupils are equal, round, and reactive to light.  Cardiovascular:     Rate and Rhythm: Normal rate and  regular rhythm.  Pulmonary:     Effort: Pulmonary effort is normal.     Breath sounds: Normal breath sounds.  Abdominal:     General: Abdomen is flat.     Palpations: Abdomen is soft.  Musculoskeletal:        General: Normal range of motion.  Skin:    General: Skin is warm and dry.       Neurological:     General: No focal deficit present.     Mental Status: She is alert. Mental status is at baseline.  Psychiatric:        Attention and Perception: Attention normal.        Mood and Affect: Mood normal.        Speech: Speech normal.        Behavior: Behavior normal.        Thought Content: Thought content normal.        Cognition and Memory: Cognition normal.   Review of Systems  Constitutional: Negative.   HENT: Negative.    Eyes: Negative.   Respiratory: Negative.    Cardiovascular: Negative.   Gastrointestinal: Negative.   Musculoskeletal: Negative.   Skin: Negative.   Neurological: Negative.   Psychiatric/Behavioral: Negative.    Blood pressure (!) 108/91, pulse 63, temperature 97.7 F (36.5 C), temperature source Oral, resp. rate 17, height 5\' 5"  (1.651 m), weight 105.7 kg, SpO2 100 %. Body mass index is 38.77 kg/m.  Mental Status Per Nursing Assessment::   On Admission:  NA  Demographic Factors:  Caucasian  Loss Factors: Loss of significant relationship  Historical Factors: Impulsivity  Risk Reduction Factors:   Sense of responsibility to family and Positive social support  Continued Clinical Symptoms:  Depression:   Impulsivity  Cognitive Features That Contribute To Risk:  None    Suicide Risk:  Minimal: No identifiable suicidal ideation.  Patients presenting with no risk factors but with morbid ruminations; may be classified as minimal risk based on the severity of the depressive symptoms    Plan Of Care/Follow-up recommendations:  Patient will continue with current medication.  Prescriptions provided.  Follow-up arrangements will be made with  outpatient clinic in [place] county.  Mordecai Rasmussen, MD 10/20/2021, 10:53 AM

## 2021-10-20 NOTE — Discharge Summary (Signed)
Physician Discharge Summary Note  Patient:  Hannah Reid is an 41 y.o., female MRN:  [id number] DOB:  1981/02/02 Patient phone:  [phone] (home)  Patient address:   [place] [address],  Total Time spent with patient: 45 minutes  Date of Admission:  10/17/2021 Date of Discharge: 10/20/2021  Reason for Admission: Presented to the emergency room with recent wrist cutting worsening mood and suicidal statements worsening depression  Principal Problem: Severe episode of recurrent major depressive disorder, without psychotic features [facility]) Discharge Diagnoses: Principal Problem:   Severe episode of recurrent major depressive disorder, without psychotic features ([place]) Active Problems:   Suicidal ideation   High blood pressure   Past Psychiatric History: History of recurrent depression and mood instability  Past Medical History: History reviewed. No pertinent past medical history.  Past Surgical History:  Procedure Laterality Date   ABDOMINAL HYSTERECTOMY     CHOLECYSTECTOMY     kidney tumor removal     Family History: History reviewed. No pertinent family history. Family Psychiatric  History: See previous Social History:  Social History   Substance and Sexual Activity  Alcohol Use Yes   Comment: socially     Social History   Substance and Sexual Activity  Drug Use Not Currently    Social History   Socioeconomic History   Marital status: Single    Spouse name: Not on file   Number of children: Not on file   Years of education: Not on file   Highest education level: Not on file  Occupational History   Not on file  Tobacco Use   Smoking status: Every Day    Packs/day: 0.50    Types: Cigarettes   Smokeless tobacco: Never  Vaping Use   Vaping Use: Some days  Substance and Sexual Activity   Alcohol use: Yes    Comment: socially   Drug use: Not Currently   Sexual activity: Not on file  Other Topics Concern   Not on file   Social History Narrative   Not on file   Social Determinants of Health   Financial Resource Strain: Not on file  Food Insecurity: Not on file  Transportation Needs: Not on file  Physical Activity: Not on file  Stress: Not on file  Social Connections: Not on file    Hospital Course: Admitted to the psychiatric unit.  15-minute checks continued.  Displayed no dangerous behavior.  No aggression or violence.  Very cooperative showing good insight.  Medicines were reviewed and some adjustments were made with her Seroquel and Cymbalta which had previously been helpful for chronic depression.  At the time of discharge patient is calm and lucid without any suicidal ideation and agreeable to outpatient plan back in her home county  Physical Findings: AIMS:  , ,  ,  ,    CIWA:    COWS:     Musculoskeletal: Strength & Muscle Tone: within normal limits Gait & Station: normal Patient leans: N/A   Psychiatric Specialty Exam:  Presentation  General Appearance: Bizarre  Eye Contact:Fair  Speech:Pressured  Speech Volume:Normal  Handedness:Right   Mood and Affect  Mood:Anxious; Depressed; Angry  Affect:Congruent; Depressed; Labile; Tearful   Thought Process  Thought Processes:Coherent  Descriptions of Associations:Intact  Orientation:Full (Time, Place and Person)  Thought Content:Logical  History of Schizophrenia/Schizoaffective disorder:No  Duration of Psychotic Symptoms:N/A  Hallucinations:No data recorded Ideas of Reference:None  Suicidal Thoughts:No data recorded Homicidal Thoughts:No data recorded  Sensorium  Memory:Immediate Fair; Remote  Fair  Judgment:Fair  Insight:Poor   Executive Functions  Concentration:Poor  Attention Span:Fair  Recall:Fair  [employer] of Knowledge:Fair  Language:Fair   Psychomotor Activity  Psychomotor Activity:No data recorded  Assets  Assets:Desire for Improvement; Housing; Vocational/Educational   Sleep  Sleep:No  data recorded   Physical Exam: Physical Exam Vitals and nursing note reviewed.  Constitutional:      Appearance: Normal appearance.  HENT:     Head: Normocephalic and atraumatic.     Mouth/Throat:     Pharynx: Oropharynx is clear.  Eyes:     Pupils: Pupils are equal, round, and reactive to light.  Cardiovascular:     Rate and Rhythm: Normal rate and regular rhythm.  Pulmonary:     Effort: Pulmonary effort is normal.     Breath sounds: Normal breath sounds.  Abdominal:     General: Abdomen is flat.     Palpations: Abdomen is soft.  Musculoskeletal:        General: Normal range of motion.  Skin:    General: Skin is warm and dry.       Neurological:     General: No focal deficit present.     Mental Status: She is alert. Mental status is at baseline.  Psychiatric:        Mood and Affect: Mood normal.        Thought Content: Thought content normal.   Review of Systems  Constitutional: Negative.   HENT: Negative.    Eyes: Negative.   Respiratory: Negative.    Cardiovascular: Negative.   Gastrointestinal: Negative.   Musculoskeletal: Negative.   Skin: Negative.   Neurological: Negative.   Psychiatric/Behavioral: Negative.    Blood pressure (!) 108/91, pulse 63, temperature 97.7 F (36.5 C), temperature source Oral, resp. rate 17, height 5\' 5"  (1.651 m), weight 105.7 kg, SpO2 100 %. Body mass index is 38.77 kg/m.   Social History   Tobacco Use  Smoking Status Every Day   Packs/day: 0.50   Types: Cigarettes  Smokeless Tobacco Never   Tobacco Cessation:  A prescription for an FDA-approved tobacco cessation medication was offered at discharge and the patient refused   Blood Alcohol level:  Lab Results  Component Value Date   ETH <10 10/16/2021    Metabolic Disorder Labs:  No results found for: HGBA1C, MPG No results found for: PROLACTIN Lab Results  Component Value Date   CHOL 260 (H) 10/19/2021   TRIG 241 (H) 10/19/2021   HDL 42 10/19/2021   CHOLHDL  6.2 10/19/2021   VLDL 48 (H) 10/19/2021   LDLCALC 170 (H) 10/19/2021    See Psychiatric Specialty Exam and Suicide Risk Assessment completed by Attending Physician prior to discharge.  Discharge destination:  Home  Is patient on multiple antipsychotic therapies at discharge:  No   Has Patient had three or more failed trials of antipsychotic monotherapy by history:  No  Recommended Plan for Multiple Antipsychotic Therapies: NA  Discharge Instructions     Diet - low sodium heart healthy   Complete by: As directed    Increase activity slowly   Complete by: As directed       Allergies as of 10/20/2021   No Known Allergies      Medication List     STOP taking these medications    cephALEXin 500 MG capsule Commonly known as: KEFLEX   naproxen 500 MG tablet Commonly known as: Naprosyn   potassium chloride SA 20 MEQ tablet Commonly known as: Jerene Dilling  predniSONE 50 MG tablet Commonly known as: DELTASONE   prochlorperazine 10 MG tablet Commonly known as: COMPAZINE   promethazine 25 MG tablet Commonly known as: PHENERGAN       TAKE these medications      Indication  DULoxetine 30 MG capsule Commonly known as: CYMBALTA Take 1 capsule (30 mg total) by mouth daily.  Indication: Major Depressive Disorder   gabapentin 100 MG capsule Commonly known as: NEURONTIN Take 1 capsule (100 mg total) by mouth 3 (three) times daily.  Indication: Social Anxiety Disorder   lisinopril 5 MG tablet Commonly known as: ZESTRIL Take 1 tablet (5 mg total) by mouth daily.  Indication: High Blood Pressure Disorder   QUEtiapine 400 MG tablet Commonly known as: SEROQUEL Take 1 tablet (400 mg total) by mouth at bedtime.  Indication: Major Depressive Disorder         Follow-up recommendations: Prescriptions and a supply of medicine prescribed.  Referral made to agencies back and still carry food follow-up.  Comments: Discharged with prescriptions from medicine.  Denies  suicidal thought.  Affect and behavior appropriate.  Signed: Alethia Berthold, MD 10/20/2021, 10:57 AM

## 2021-10-20 NOTE — Progress Notes (Signed)
Recreation Therapy Notes  Date: 10/20/2021  Time: 10:05 am    Location: Craft room   Behavioral response: Appropriate   Intervention Topic: Time Management     Discussion/Intervention:  Group content today was focused on time management. The group defined time management and identified healthy ways to manage time. Individuals expressed how much of the 24 hours they use in a day. Patients expressed how much time they use just for themselves personally. The group expressed how they have managed their time in the past. Individuals participated in the intervention Managing Life where they had a chance to see how much of the 24 hours they use and where it goes.  Clinical Observations/Feedback: Patient came to group and was focused on what peers and staff had to say about time management. Individual was social with peers and staff while participating in the intervention.  Albertus Chiarelli LRT/CTRS            Keara Pagliarulo 10/20/2021 11:09 AM

## 2021-10-20 NOTE — Progress Notes (Addendum)
°  [facility] Adult Case Management Discharge Plan :  Will you be returning to the same living situation after discharge:  No. Patient intends to return to mothers home in [facility] [facility]. At discharge, do you have transportation home?: YES; CSW coordinated with safe transport. Patient has signed the [facility] transportation waiver which was emailed to transportation office.  Do you have the ability to pay for your medications: No. No insurance noted.   Release of information consent forms completed and in the chart;  Patient's signature needed at discharge.  Patient to Follow up at:  Follow-up Information     [facility]. Go to.   Specialty: Psychiatry Why: Please present for walk in hours 0800 to 1500 to start outpatient mental health services. Inform them you are following up for after hospitalization in order to be prioritized. Contact information: [address] [place] [address] [phone]         [facility]. Go to.   Why: Please present to walk in clinic to start mental health services. Facility is a 24 hour location. Contact information: [address] [place] [address] [phone]                 Next level of care provider has access to [facility] Link:no  Safety Planning and Suicide Prevention discussed: Yes,  SPE completed with patient and Burnard Hawthorne, Mother.    Has patient been referred to the Quitline?: Patient refused referral  Patient has been referred for addiction treatment: Yes Patient referred to outpatient SUD treatment at [facility]. See above.  Corky Crafts, LCSWA 10/20/2021, 12:31 PM

## 2021-10-20 NOTE — Plan of Care (Signed)
°  Problem: Safety: Goal: Violent Restraint(s) Outcome: Adequate for Discharge   Problem: Education: Goal: Knowledge of [place] [facility] Education information/materials will improve Outcome: Adequate for Discharge Goal: Emotional status will improve Outcome: Adequate for Discharge Goal: Mental status will improve Outcome: Adequate for Discharge Goal: Verbalization of understanding the information provided will improve Outcome: Adequate for Discharge   Problem: Activity: Goal: Interest or engagement in activities will improve Outcome: Adequate for Discharge Goal: Sleeping patterns will improve Outcome: Adequate for Discharge   Problem: Coping: Goal: Ability to verbalize frustrations and anger appropriately will improve Outcome: Adequate for Discharge Goal: Ability to demonstrate self-control will improve Outcome: Adequate for Discharge   Problem: Health Behavior/Discharge Planning: Goal: Identification of resources available to assist in meeting health care needs will improve Outcome: Adequate for Discharge Goal: Compliance with treatment plan for underlying cause of condition will improve Outcome: Adequate for Discharge   Problem: Physical Regulation: Goal: Ability to maintain clinical measurements within normal limits will improve Outcome: Adequate for Discharge   Problem: Safety: Goal: Periods of time without injury will increase Outcome: Adequate for Discharge   Problem: Education: Goal: Ability to state activities that reduce stress will improve Outcome: Adequate for Discharge   Problem: Coping: Goal: Ability to identify and develop effective coping behavior will improve Outcome: Adequate for Discharge   Problem: Self-Concept: Goal: Ability to identify factors that promote anxiety will improve Outcome: Adequate for Discharge Goal: Level of anxiety will decrease Outcome: Adequate for Discharge Goal: Ability to modify response to factors that promote anxiety  will improve Outcome: Adequate for Discharge   Problem: Education: Goal: Utilization of techniques to improve thought processes will improve Outcome: Adequate for Discharge Goal: Knowledge of the prescribed therapeutic regimen will improve Outcome: Adequate for Discharge   Problem: Activity: Goal: Interest or engagement in leisure activities will improve Outcome: Adequate for Discharge Goal: Imbalance in normal sleep/wake cycle will improve Outcome: Adequate for Discharge   Problem: Coping: Goal: Coping ability will improve Outcome: Adequate for Discharge Goal: Will verbalize feelings Outcome: Adequate for Discharge   Problem: Health Behavior/Discharge Planning: Goal: Ability to make decisions will improve Outcome: Adequate for Discharge Goal: Compliance with therapeutic regimen will improve Outcome: Adequate for Discharge   Problem: Role Relationship: Goal: Will demonstrate positive changes in social behaviors and relationships Outcome: Adequate for Discharge   Problem: Safety: Goal: Ability to disclose and discuss suicidal ideas will improve Outcome: Adequate for Discharge Goal: Ability to identify and utilize support systems that promote safety will improve Outcome: Adequate for Discharge   Problem: Self-Concept: Goal: Will verbalize positive feelings about self Outcome: Adequate for Discharge Goal: Level of anxiety will decrease Outcome: Adequate for Discharge

## 2021-12-22 ENCOUNTER — Encounter: Payer: Self-pay | Admitting: Psychiatry

## 2021-12-22 ENCOUNTER — Inpatient Hospital Stay
Admission: AD | Admit: 2021-12-22 | Discharge: 2021-12-24 | DRG: 885 | Disposition: A | Discharge status: Home / Self Care | Payer: 59 | Source: Intra-hospital | Attending: Psychiatry | Admitting: Psychiatry

## 2021-12-22 ENCOUNTER — Emergency Department
Admission: EM | Admit: 2021-12-22 | Discharge: 2021-12-22 | Disposition: A | Discharge status: Other Acute Inpatient Hospital | Payer: Self-pay | Attending: Emergency Medicine | Admitting: Emergency Medicine

## 2021-12-22 ENCOUNTER — Encounter: Payer: Self-pay | Admitting: Emergency Medicine

## 2021-12-22 ENCOUNTER — Other Ambulatory Visit: Payer: Self-pay

## 2021-12-22 DIAGNOSIS — F419 Anxiety disorder, unspecified: Secondary | ICD-10-CM | POA: Insufficient documentation

## 2021-12-22 DIAGNOSIS — Z9152 Personal history of nonsuicidal self-harm: Secondary | ICD-10-CM | POA: Diagnosis not present

## 2021-12-22 DIAGNOSIS — I10 Essential (primary) hypertension: Secondary | ICD-10-CM | POA: Diagnosis present

## 2021-12-22 DIAGNOSIS — Z20822 Contact with and (suspected) exposure to covid-19: Secondary | ICD-10-CM | POA: Diagnosis present

## 2021-12-22 DIAGNOSIS — R45851 Suicidal ideations: Secondary | ICD-10-CM | POA: Diagnosis present

## 2021-12-22 DIAGNOSIS — F322 Major depressive disorder, single episode, severe without psychotic features: Principal | ICD-10-CM | POA: Diagnosis present

## 2021-12-22 DIAGNOSIS — F29 Unspecified psychosis not due to a substance or known physiological condition: Secondary | ICD-10-CM | POA: Insufficient documentation

## 2021-12-22 DIAGNOSIS — Z90710 Acquired absence of both cervix and uterus: Secondary | ICD-10-CM | POA: Diagnosis not present

## 2021-12-22 DIAGNOSIS — Z79899 Other long term (current) drug therapy: Secondary | ICD-10-CM | POA: Diagnosis not present

## 2021-12-22 DIAGNOSIS — Z9049 Acquired absence of other specified parts of digestive tract: Secondary | ICD-10-CM | POA: Diagnosis not present

## 2021-12-22 DIAGNOSIS — G4700 Insomnia, unspecified: Secondary | ICD-10-CM | POA: Diagnosis present

## 2021-12-22 DIAGNOSIS — F17210 Nicotine dependence, cigarettes, uncomplicated: Secondary | ICD-10-CM | POA: Diagnosis present

## 2021-12-22 LAB — RESP PANEL BY RT-PCR (FLU A&B, COVID) ARPGX2
Influenza A by PCR: NEGATIVE
Influenza B by PCR: NEGATIVE
SARS Coronavirus 2 by RT PCR: NEGATIVE

## 2021-12-22 LAB — ETHANOL: Alcohol, Ethyl (B): <10 mg/dL (ref <10)

## 2021-12-22 LAB — COMPREHENSIVE METABOLIC PANEL
ALT: 17 U/L (ref 0–44)
AST: 19 U/L (ref 15–41)
Albumin: 4.2 g/dL (ref 3.5–5.0)
Alkaline Phosphatase: 67 U/L (ref 38–126)
Anion gap: 8 (ref 5–15)
BUN: 15 mg/dL (ref 6–20)
CO2: 25 mmol/L (ref 22–32)
Calcium: 9 mg/dL (ref 8.9–10.3)
Chloride: 105 mmol/L (ref 98–111)
Creatinine, Ser: 0.73 mg/dL (ref 0.44–1.00)
GFR, Estimated: >60 mL/min (ref >60)
Glucose, Bld: 104 mg/dL — ABNORMAL HIGH (ref 70–99)
Potassium: 3.8 mmol/L (ref 3.5–5.1)
Sodium: 138 mmol/L (ref 135–145)
Total Bilirubin: 0.6 mg/dL (ref 0.3–1.2)
Total Protein: 7.3 g/dL (ref 6.5–8.1)

## 2021-12-22 LAB — URINE DRUG SCREEN, QUALITATIVE (ARMC ONLY)
Amphetamines, Ur Screen: NOT DETECTED
Barbiturates, Ur Screen: NOT DETECTED
Benzodiazepine, Ur Scrn: NOT DETECTED
Cannabinoid 50 Ng, Ur ~~LOC~~: POSITIVE — AB
Cocaine Metabolite,Ur ~~LOC~~: NOT DETECTED
MDMA (Ecstasy)Ur Screen: NOT DETECTED
Methadone Scn, Ur: NOT DETECTED
Opiate, Ur Screen: NOT DETECTED
Phencyclidine (PCP) Ur S: NOT DETECTED
Tricyclic, Ur Screen: POSITIVE — AB

## 2021-12-22 LAB — ACETAMINOPHEN LEVEL: Acetaminophen (Tylenol), Serum: <10 ug/mL — ABNORMAL LOW (ref 10–30)

## 2021-12-22 LAB — CBC
HCT: 41.4 % (ref 36.0–46.0)
Hemoglobin: 14.4 g/dL (ref 12.0–15.0)
MCH: 31 pg (ref 26.0–34.0)
MCHC: 34.8 g/dL (ref 30.0–36.0)
MCV: 89 fL (ref 80.0–100.0)
Platelets: 325 10*3/uL (ref 150–400)
RBC: 4.65 MIL/uL (ref 3.87–5.11)
RDW: 12.8 % (ref 11.5–15.5)
WBC: 6.2 10*3/uL (ref 4.0–10.5)
nRBC: 0 % (ref 0.0–0.2)

## 2021-12-22 LAB — POC URINE PREG, ED: Preg Test, Ur: NEGATIVE

## 2021-12-22 LAB — TSH: TSH: 0.735 u[IU]/mL (ref 0.350–4.500)

## 2021-12-22 LAB — SALICYLATE LEVEL: Salicylate Lvl: <7 mg/dL — ABNORMAL LOW (ref 7.0–30.0)

## 2021-12-22 MED ORDER — QUETIAPINE FUMARATE 200 MG PO TABS
400.0000 mg | ORAL_TABLET | Freq: Once | ORAL | Status: AC
  Administered 2021-12-22: 400 mg via ORAL
  Filled 2021-12-22: qty 2

## 2021-12-22 MED ORDER — GABAPENTIN 100 MG PO CAPS
100.0000 mg | ORAL_CAPSULE | Freq: Once | ORAL | Status: AC
  Administered 2021-12-22: 100 mg via ORAL
  Filled 2021-12-22: qty 1

## 2021-12-22 MED ORDER — ACETAMINOPHEN 325 MG PO TABS: 650.0000 mg | ORAL_TABLET | Freq: Four times a day (QID) | ORAL | Status: DC | PRN

## 2021-12-22 MED ORDER — MAGNESIUM HYDROXIDE 400 MG/5ML PO SUSP: 30.0000 mL | Freq: Every day | ORAL | Status: DC | PRN

## 2021-12-22 MED ORDER — ALUM & MAG HYDROXIDE-SIMETH 200-200-20 MG/5ML PO SUSP: 30.0000 mL | ORAL | Status: DC | PRN

## 2021-12-22 NOTE — ED Notes (Signed)
Report called to Matt RN in BMU.  

## 2021-12-22 NOTE — ED Triage Notes (Signed)
Pt via POV from home. Pt escorted by [employer], pt is voluntary. Pt was recently discharge from the BMU in January, pt was having an anxiety attack. States she feels like the medications she is on is not helping in. States she was having SI thoughts this AM but none right now. Pt does endorses auditory hallucinations. Denies ETOH. Does endorses marijuana use.  ? ?Pt belongings include (2 bags0:  ?1 pair of checkered shoes  ?1 pair of black socks  ?1 black cell phone  ?1 dark green hoodie ?1 black tank top  ?1 pair of camo leggings  ?1 blue bra  ?1 blue cheetah print bookbag ?1 silver colored nose ring  ?1 silver colored necklace  ?1 silver colored lip ring  ?4 silver colored rings ?

## 2021-12-22 NOTE — ED Notes (Signed)
Voluntary consent signed and placed on chart with ER secretary.  ?

## 2021-12-22 NOTE — ED Notes (Signed)
Patient resting quietly in room. No noted distress or abnormal behaviors noted. Will continue 15 minute checks. 

## 2021-12-22 NOTE — ED Notes (Signed)
VOLUNTARY to be admitted to [facility] after 8P ?

## 2021-12-22 NOTE — Consult Note (Signed)
[facility]  ? ?Reason for Consult: "anxiety attack"/SI   ?Referring Physician:  Darnelle CatalanMalinda ?Patient Identification: Hannah LeafKisha N Reid ?MRN:  [id number] ?Principal Diagnosis: Suicidal ideation ?Diagnosis:  Principal Problem: ?  Suicidal ideation ? ? ?Total Time spent with patient: 45 minutes ? ?Subjective: "I have been on a roller coaster." ?Hannah LeafKisha N Reid is a 41 y.o. female patient admitted with suicidal thoughts. ? ?HPI:  Patient seen and chart reviewed.  ? ?Past Psychiatric History: Patient seen and chart reviewed.  Patient states she was here in January, felt a little better at discharge but then says that that Cymbalta really did not make her feel any better at all.  She states that she is out of her Seroquel.  She reports that she did follow-up with [facility] but they did not "do anything" and did not refill her medications.  She called [facility] today and they told her to come here because she was feeling suicidal after a break-up with her girlfriend today.  She states that she is not using any illicit substances, aside from cannabinoids.  UDS positive for cannabinoids and tricyclics patient states that she thought about cutting her wrist today; reports that she has done that before, requiring sutures. Recommend inpatient hospitalization for stabilization.  ? ? ?Risk to Self:   ?Risk to Others:   ?Prior Inpatient Therapy:   ?Prior Outpatient Therapy:   ? ?Past Medical History: History reviewed. No pertinent past medical history.  ?Past Surgical History:  ?Procedure Laterality Date  ? ABDOMINAL HYSTERECTOMY    ? CHOLECYSTECTOMY    ? kidney tumor removal    ? ?Family History: History reviewed. No pertinent family history. ?Family Psychiatric  History: see prior ?Social History:  ?Social History  ? ?Substance and Sexual Activity  ?Alcohol Use Yes  ? Comment: socially  ?   ?Social History  ? ?Substance and Sexual Activity  ?Drug Use Not Currently  ?  ?Social History  ? ?Socioeconomic History  ?  Marital status: Single  ?  Spouse name: Not on file  ? Number of children: Not on file  ? Years of education: Not on file  ? Highest education level: Not on file  ?Occupational History  ? Not on file  ?Tobacco Use  ? Smoking status: Every Day  ?  Packs/day: 0.50  ?  Types: Cigarettes  ? Smokeless tobacco: Never  ?Vaping Use  ? Vaping Use: Some days  ?Substance and Sexual Activity  ? Alcohol use: Yes  ?  Comment: socially  ? Drug use: Not Currently  ? Sexual activity: Not on file  ?Other Topics Concern  ? Not on file  ?Social History Narrative  ? Not on file  ? ?Social Determinants of Health  ? ?Financial Resource Strain: Not on file  ?Food Insecurity: Not on file  ?Transportation Needs: Not on file  ?Physical Activity: Not on file  ?Stress: Not on file  ?Social Connections: Not on file  ? ?Additional Social History: ?  ? ?Allergies:  No Known Allergies ? ?Labs:  ?Results for orders placed or performed during the hospital encounter of 12/22/21 (from the past 48 hour(s))  ?POC urine preg, ED (not at [facility])     Status: None  ? Collection Time: 12/22/21  2:49 PM  ?Result Value Ref Range  ? Preg Test, Ur NEGATIVE NEGATIVE  ?  Comment:        ?THE SENSITIVITY OF THIS ?METHODOLOGY IS >24 mIU/mL ?  ?Comprehensive metabolic panel  Status: Abnormal  ? Collection Time: 12/22/21  2:56 PM  ?Result Value Ref Range  ? Sodium 138 135 - 145 mmol/L  ? Potassium 3.8 3.5 - 5.1 mmol/L  ? Chloride 105 98 - 111 mmol/L  ? CO2 25 22 - 32 mmol/L  ? Glucose, Bld 104 (H) 70 - 99 mg/dL  ?  Comment: Glucose reference range applies only to samples taken after fasting for at least 8 hours.  ? BUN 15 6 - 20 mg/dL  ? Creatinine, Ser 0.73 0.44 - 1.00 mg/dL  ? Calcium 9.0 8.9 - 10.3 mg/dL  ? Total Protein 7.3 6.5 - 8.1 g/dL  ? Albumin 4.2 3.5 - 5.0 g/dL  ? AST 19 15 - 41 U/L  ? ALT 17 0 - 44 U/L  ? Alkaline Phosphatase 67 38 - 126 U/L  ? Total Bilirubin 0.6 0.3 - 1.2 mg/dL  ? GFR, Estimated >60 >60 mL/min  ?  Comment: (NOTE) ?Calculated using the  CKD-EPI Creatinine Equation (2021) ?  ? Anion gap 8 5 - 15  ?  Comment: Performed at [facility], [address], [place], [place] [address]  ?Ethanol     Status: None  ? Collection Time: 12/22/21  2:56 PM  ?Result Value Ref Range  ? Alcohol, Ethyl (B) <10 <10 mg/dL  ?  Comment: (NOTE) ?Lowest detectable limit for serum alcohol is 10 mg/dL. ? ?For medical purposes only. ?Performed at [facility], [address] [place], ?[place] [address] ?  ?Salicylate level     Status: Abnormal  ? Collection Time: 12/22/21  2:56 PM  ?Result Value Ref Range  ? Salicylate Lvl <7.0 (L) 7.0 - 30.0 mg/dL  ?  Comment: Performed at [facility], [address] [place], [place] [address]  ?Acetaminophen level     Status: Abnormal  ? Collection Time: 12/22/21  2:56 PM  ?Result Value Ref Range  ? Acetaminophen (Tylenol), Serum <10 (L) 10 - 30 ug/mL  ?  Comment: (NOTE) ?Therapeutic concentrations vary significantly. A range of 10-30 ug/mL  ?may be an effective concentration for many patients. However, some  ?are best treated at concentrations outside of this range. ?Acetaminophen concentrations >150 ug/mL at 4 hours after ingestion  ?and >50 ug/mL at 12 hours after ingestion are often associated with  ?toxic reactions. ? ?Performed at [facility], [address] [place], ?[place] [address] ?  ?cbc     Status: None  ? Collection Time: 12/22/21  2:56 PM  ?Result Value Ref Range  ? WBC 6.2 4.0 - 10.5 K/uL  ? RBC 4.65 3.87 - 5.11 MIL/uL  ? Hemoglobin 14.4 12.0 - 15.0 g/dL  ? HCT 41.4 36.0 - 46.0 %  ? MCV 89.0 80.0 - 100.0 fL  ? MCH 31.0 26.0 - 34.0 pg  ? MCHC 34.8 30.0 - 36.0 g/dL  ? RDW 12.8 11.5 - 15.5 %  ? Platelets 325 150 - 400 K/uL  ? nRBC 0.0 0.0 - 0.2 %  ?  Comment: Performed at [facility], [address] [place], [place] [address]  ?Urine Drug Screen, Qualitative     Status: Abnormal  ? Collection Time: 12/22/21  2:56 PM  ?Result Value Ref Range  ? Tricyclic, Ur Screen POSITIVE  (A) NONE DETECTED  ? Amphetamines, Ur Screen NONE DETECTED NONE DETECTED  ? MDMA (Ecstasy)Ur Screen NONE DETECTED NONE DETECTED  ? Cocaine Metabolite,Ur [place] NONE DETECTED NONE DETECTED  ? Opiate, Ur Screen NONE DETECTED NONE DETECTED  ?  Phencyclidine (PCP) Ur S NONE DETECTED NONE DETECTED  ? Cannabinoid 50 Ng, Ur [place] POSITIVE (A) NONE DETECTED  ? Barbiturates, Ur Screen NONE DETECTED NONE DETECTED  ? Benzodiazepine, Ur Scrn NONE DETECTED NONE DETECTED  ? Methadone Scn, Ur NONE DETECTED NONE DETECTED  ?  Comment: (NOTE) ?Tricyclics + metabolites, urine    Cutoff 1000 ng/mL ?Amphetamines + metabolites, urine  Cutoff 1000 ng/mL ?MDMA (Ecstasy), urine              Cutoff 500 ng/mL ?Cocaine Metabolite, urine          Cutoff 300 ng/mL ?Opiate + metabolites, urine        Cutoff 300 ng/mL ?Phencyclidine (PCP), urine         Cutoff 25 ng/mL ?Cannabinoid, urine                 Cutoff 50 ng/mL ?Barbiturates + metabolites, urine  Cutoff 200 ng/mL ?Benzodiazepine, urine              Cutoff 200 ng/mL ?Methadone, urine                   Cutoff 300 ng/mL ? ?The urine drug screen provides only a preliminary, unconfirmed ?analytical test result and should not be used for non-medical ?purposes. Clinical consideration and professional judgment should ?be applied to any positive drug screen result due to possible ?interfering substances. A more specific alternate chemical method ?must be used in order to obtain a confirmed analytical result. ?Gas chromatography / mass spectrometry (GC/MS) is the preferred ?confirm atory method. ?Performed at [facility], [address] [place], ?[place] [address] ?  ?Resp Panel by RT-PCR (Flu A&B, Covid) Nasopharyngeal Swab     Status: None  ? Collection Time: 12/22/21  3:31 PM  ? Specimen: Nasopharyngeal Swab; Nasopharyngeal(NP) swabs in vial transport medium  ?Result Value Ref Range  ? SARS Coronavirus 2 by RT PCR NEGATIVE NEGATIVE  ?  Comment: (NOTE) ?SARS-CoV-2 target nucleic acids are NOT  DETECTED. ? ?The SARS-CoV-2 RNA is generally detectable in upper respiratory ?specimens during the acute phase of infection. The lowest ?concentration of SARS-CoV-2 viral copies this assay can detect is ?138

## 2021-12-22 NOTE — Tx Team (Signed)
Initial Treatment Plan ?12/22/2021 ?9:39 PM ?Christean Leaf ?FTD:[id number] ? ? ? ?PATIENT STRESSORS: ?Marital or family conflict   ?Medication change or noncompliance   ? ? ?PATIENT STRENGTHS: ?Ability for insight  ?Motivation for treatment/growth  ? ? ?PATIENT IDENTIFIED PROBLEMS: ?Depression  ?Anxiety  ?  ?  ?  ?  ?  ?  ?  ?  ? ?DISCHARGE CRITERIA:  ?Improved stabilization in mood, thinking, and/or behavior ?Verbal commitment to aftercare and medication compliance ? ?PRELIMINARY DISCHARGE PLAN: ?Outpatient therapy ?Return to previous living arrangement ? ?PATIENT/FAMILY INVOLVEMENT: ?This treatment plan has been presented to and reviewed with the patient, KEELY DRENNAN. The patient has been given the opportunity to ask questions and make suggestions. ? ?Elmyra Ricks, RN ?12/22/2021, 9:39 PM ?

## 2021-12-22 NOTE — BH Assessment (Signed)
Comprehensive Clinical Assessment (CCA) Note  12/22/2021 Hannah Reid [id number]  Hannah Reid, 41 year old female who presents to [facility] ED involuntarily for treatment. Per triage note, Pt via POV from home. Pt escorted by BPD, pt is voluntary. Pt was recently discharged from the BMU in January, pt was having an anxiety attack. States she feels like the medications she is on is not helping in. States she was having SI thoughts this AM but none right now. Pt does endorse auditory hallucinations. Denies ETOH. Does endorse marijuana use.   During TTS assessment pt presents alert and oriented x 4, restless but cooperative, and mood-congruent with affect. The pt does not appear to be responding to internal or external stimuli. Neither is the pt presenting with any delusional thinking. Pt verified the information provided to triage RN.   Pt identifies her main complaint to be that her medication (Cymbalta) is not working, and her depression has worsened over the past couple of weeks. Patient was discharged from [facility] in January and followed up with [facility]. Patient states she is out of Seroquel and she has not been able to refill her prescriptions. Patient reports she called [facility] today and they suggested she come to the ED because she was having suicidal ideations due to a toxic breakup with her partner. Patient admits to using marijuana. Pt endorses passive SI and denies HI/AH/VH. Pt is not able to contract for safety. Patient has past self-injurious behaviors.    Per [name], NP, pt is recommended for inpatient psychiatric admission.  Chief Complaint:  Chief Complaint  Patient presents with   Psychiatric Evaluation   Visit Diagnosis: Suicidal ideation    CCA Screening, Triage and Referral (STR)  Patient Reported Information How did you hear about [place]? Self  Referral name: No data recorded Referral phone number: No data recorded  Whom do you see for routine medical problems? No data  recorded Practice/Facility Name: No data recorded Practice/Facility Phone Number: No data recorded Name of Contact: No data recorded Contact Number: No data recorded Contact Fax Number: No data recorded Prescriber Name: No data recorded Prescriber Address (if known): No data recorded  What Is the Reason for Your Visit/Call Today? Patient presents to ED voluntarily for SI and worsening depression.  How Long Has This Been Causing You Problems? 1 wk - 1 month  What Do You Feel Would Help You the Most Today? Treatment for Depression or other mood problem; Medication(s); Social Support   Have You Recently Been in Any Inpatient Treatment (Hospital/Detox/Crisis Center/28-Day Program)? No data recorded Name/Location of Program/Hospital:No data recorded How Long Were You There? No data recorded When Were You Discharged? No data recorded  Have You Ever Received Services From [facility] Before? No data recorded Who Do You See at [facility]? No data recorded  Have You Recently Had Any Thoughts About Hurting Yourself? Yes  Are You Planning to Commit Suicide/Harm Yourself At This time? No   Have you Recently Had Thoughts About Hurting Someone [name]? No  Explanation: No data recorded  Have You Used Any Alcohol or Drugs in the Past 24 Hours? Yes  How Long Ago Did You Use Drugs or Alcohol? No data recorded What Did You Use and How Much? Marijuana- unknown   Do You Currently Have a Therapist/Psychiatrist? Yes  Name of Therapist/Psychiatrist: [facility]   Have You Been Recently Discharged From Any Office Practice or Programs? No  Explanation of Discharge From Practice/Program: No data recorded    CCA  Screening Triage Referral Assessment Type of Contact: Face-to-Face  Is this Initial or Reassessment? No data recorded Date Telepsych consult ordered in [facility]:  No data recorded Time Telepsych consult ordered in [facility]:  No data recorded  Patient Reported Information Reviewed? No  data recorded Patient Left Without Being Seen? No data recorded Reason for Not Completing Assessment: No data recorded  Collateral Involvement: None provided   Does Patient Have a Court Appointed Legal Guardian? No data recorded Name and Contact of Legal Guardian: No data recorded If Minor and Not Living with Parent(s), Who has Custody? n/a  Is CPS involved or ever been involved? Never  Is APS involved or ever been involved? Never   Patient Determined To Be At Risk for Harm To Self or Others Based on Review of Patient Reported Information or Presenting Complaint? Yes, for Self-Harm  Method: No data recorded Availability of Means: No data recorded Intent: No data recorded Notification Required: No data recorded Additional Information for Danger to Others Potential: No data recorded Additional Comments for Danger to Others Potential: No data recorded Are There Guns or Other Weapons in Your Home? No data recorded Types of Guns/Weapons: No data recorded Are These Weapons Safely Secured?                            No data recorded Who Could Verify You Are Able To Have These Secured: No data recorded Do You Have any Outstanding Charges, Pending Court Dates, Parole/Probation? No data recorded Contacted To Inform of Risk of Harm To Self or Others: No data recorded  Location of Assessment: [facility] ED   Does Patient Present under Involuntary Commitment? No  IVC Papers Initial File Date: 10/17/21   [place] of Residence: [place]   Patient Currently Receiving the Following Services: IOP (Intensive Outpatient Program); Medication Management   Determination of Need: Urgent (48 hours)   Options For Referral: ED Visit; Inpatient Hospitalization; Medication Management; Intensive Outpatient Therapy     CCA Biopsychosocial Intake/Chief Complaint:  No data recorded Current Symptoms/Problems: No data recorded  Patient Reported Schizophrenia/Schizoaffective Diagnosis in Past:  No   Strengths: Patient is able to communicate and verbalize needs.  Preferences: No data recorded Abilities: No data recorded  Type of Services Patient Feels are Needed: No data recorded  Initial Clinical Notes/Concerns: No data recorded  Mental Health Symptoms Depression:   Change in energy/activity; Difficulty Concentrating; Hopelessness; Sleep (too much or little); Fatigue; Increase/decrease in appetite   Duration of Depressive symptoms:  Greater than two weeks   Mania:   None   Anxiety:    Difficulty concentrating; Fatigue; Irritability; Restlessness; Worrying   Psychosis:   None   Duration of Psychotic symptoms:  N/A   Trauma:   Re-experience of traumatic event   Obsessions:   None   Compulsions:   None   Inattention:   None   Hyperactivity/Impulsivity:   None   Oppositional/Defiant Behaviors:   None   Emotional Irregularity:   Potentially harmful impulsivity; Intense/unstable relationships   Other Mood/Personality Symptoms:  No data recorded   Mental Status Exam Appearance and self-care  Stature:   Average   Weight:   Average weight   Clothing:   Casual   Grooming:   Normal   Cosmetic use:   None   Posture/gait:   Slumped   Motor activity:   Not Remarkable   Sensorium  Attention:   Normal   Concentration:   Normal  Orientation:   X5   Recall/memory:   Normal   Affect and Mood  Affect:   Anxious; Depressed   Mood:   Anxious; Depressed   Relating  Eye contact:   Normal   Facial expression:   Anxious; Depressed   Attitude toward examiner:   Cooperative   Thought and Language  Speech flow:  Clear and Coherent   Thought content:   Appropriate to Mood and Circumstances   Preoccupation:   None   Hallucinations:   None   Organization:  No data recorded  [employer] of Knowledge:   Average   Intelligence:   Average   Abstraction:   Normal   Judgement:   Fair   Reality  Testing:   Adequate   Insight:   Fair   Decision Making:   Normal   Social Functioning  Social Maturity:   Isolates   Social Judgement:   Heedless   Stress  Stressors:   Relationship   Coping Ability:   Human resources officer Deficits:   None   Supports:   Friends/Service system     Religion:    Leisure/Recreation:    Exercise/Diet: Exercise/Diet Do You Have Any Trouble Sleeping?: Yes Explanation of Sleeping Difficulties: Patient has difficulty sleeping. Ran out of Seroquel   CCA Employment/Education Employment/Work Situation:    Education:     CCA Family/Childhood History Family and Relationship History: Family history Marital status: Single Does patient have children?: Yes How many children?: 2  Childhood History:     Child/Adolescent Assessment:     CCA Substance Use Alcohol/Drug Use: Alcohol / Drug Use Pain Medications: See MAR Prescriptions: See MAR Over the Counter: See MAR History of alcohol / drug use?: Yes Longest period of sobriety (when/how long): Unknown Substance #1 Name of Substance 1: Marijuana 1 - Frequency: daily                       ASAM's:  Six Dimensions of Multidimensional Assessment  Dimension 1:  Acute Intoxication and/or Withdrawal Potential:      Dimension 2:  Biomedical Conditions and Complications:      Dimension 3:  Emotional, Behavioral, or Cognitive Conditions and Complications:     Dimension 4:  Readiness to Change:     Dimension 5:  Relapse, Continued use, or Continued Problem Potential:     Dimension 6:  Recovery/Living Environment:     ASAM Severity Score:    ASAM Recommended Level of Treatment:     Substance use Disorder (SUD)    Recommendations for Services/Supports/Treatments:    DSM5 Diagnoses: Patient Active Problem List   Diagnosis Date Noted   High blood pressure 10/19/2021   Major depressive disorder, single episode, severe without psychotic features (HCC) 10/17/2021    Severe episode of recurrent major depressive disorder, without psychotic features (HCC) 10/17/2021   Aggressive behavior    Suicidal ideation     Patient Centered Plan: Patient is on the following Treatment Plan(s):  Anxiety and Depression   Referrals to Alternative Service(s): Referred to Alternative Service(s):   Place:   Date:   Time:    Referred to Alternative Service(s):   Place:   Date:   Time:    Referred to Alternative Service(s):   Place:   Date:   Time:    Referred to Alternative Service(s):   Place:   Date:   Time:      @BHCOLLABOFCARE @  Vernica Wachtel R Theatre manager, Counselor, LCAS-A

## 2021-12-22 NOTE — BH Assessment (Signed)
Patient is to be admitted to [facility] tonight 12/22/21 after 8:00pm by Dr. Toni Amend.  ?Attending Physician will be Dr.  Toni Amend .   ?Patient has been assigned to room 306, by [facility] Charge Nurse Matty Deamer.   ? ?ER staff is aware of the admission: ?Drinda Butts, ER Secretary   ?Dr. Darnelle Catalan, ER MD  ?Amy, Patient's Nurse  ?Sue Lush, Patient Access.  ?

## 2021-12-22 NOTE — Progress Notes (Signed)
Patient admitted from [facility] - ED, report received from Amy, RN. Pt calm and cooperative during assessment. Pt denies SI/HI/AVH stating she feels safe in here. Pt stated she had some SI prior to coming to the hospital. Pt stated she has been taking her outpatient medications but feels that they aren't working. Pt oriented to the unit and her room, provided snack. Pt complaint with medication administration per MD orders. Pt given education, support, and encouragement to be active in her treatment plan. Pt being monitored Q 15 minutes for safety per unit protocol. Pt remains safe on the unit.  ?

## 2021-12-22 NOTE — Plan of Care (Signed)
Patient new to the unit tonight, hasn't had time to progress  Problem: Education: Goal: Knowledge of [facility] Education information/materials will improve Outcome: Not Progressing Goal: Emotional status will improve Outcome: Not Progressing Goal: Mental status will improve Outcome: Not Progressing Goal: Verbalization of understanding the information provided will improve Outcome: Not Progressing   Problem: Safety: Goal: Periods of time without injury will increase Outcome: Not Progressing   Problem: Education: Goal: Ability to make informed decisions regarding treatment will improve Outcome: Not Progressing   Problem: Medication: Goal: Compliance with prescribed medication regimen will improve Outcome: Not Progressing   

## 2021-12-22 NOTE — ED Notes (Signed)
Pt received snack tray and sprite to drink ? ?

## 2021-12-22 NOTE — ED Provider Notes (Signed)
? ?[facility] ?Provider Note ? ? ? Event Date/Time  ? First MD Initiated Contact with Patient 12/22/21 1510   ?  (approximate) ? ? ?History  ? ?Psychiatric Evaluation ? ? ?HPI ? ?Hannah Reid is a 41 y.o. female who reports that she her anxiety is acting up her meds do not seem to be working.  She is hearing voices.  She occasionally gets so anxious she cannot breathe she says.  She had an episode of suicidal ideation earlier this morning. ? ?Patient reports she smokes cigarettes and marijuana. ?Past medical history is anxiety attacks similar to these and hypertension ? ?Physical Exam  ? ?Triage Vital Signs: ?ED Triage Vitals  ?Enc Vitals Group  ?   BP 12/22/21 1443 (!) 143/99  ?   Pulse Rate 12/22/21 1443 87  ?   Resp 12/22/21 1443 18  ?   Temp 12/22/21 1443 98.4 ?F (36.9 ?C)  ?   Temp src --   ?   SpO2 12/22/21 1443 96 %  ?   Weight 12/22/21 1450 230 lb (104.3 kg)  ?   Height 12/22/21 1450 5\' 5"  (1.651 m)  ?   Head Circumference --   ?   Peak Flow --   ?   Pain Score 12/22/21 1449 0  ?   Pain Loc --   ?   Pain Edu? --   ?   Excl. in GC? --   ? ? ?Most recent vital signs: ?Vitals:  ? 12/22/21 1443 12/22/21 1927  ?BP: (!) 143/99 (!) 128/95  ?Pulse: 87 98  ?Resp: 18 18  ?Temp: 98.4 ?F (36.9 ?C)   ?SpO2: 96% 95%  ? ? ?General: Awake, alert, no distress.  ?CV:  Good peripheral perfusion.  Heart regular rate and rhythm no audible murmurs ?Resp:  Normal effort.  Lungs are clear ?Abd:  No distention.  Soft nontender ?Extremities: No edema or tenderness ? ? ?ED Results / Procedures / Treatments  ? ?Labs ?(all labs ordered are listed, but only abnormal results are displayed) ?Labs Reviewed  ?COMPREHENSIVE METABOLIC PANEL - Abnormal; Notable for the following components:  ?    Result Value  ? Glucose, Bld 104 (*)   ? All other components within normal limits  ?SALICYLATE LEVEL - Abnormal; Notable for the following components:  ? Salicylate Lvl <7.0 (*)   ? All other components within normal limits   ?ACETAMINOPHEN LEVEL - Abnormal; Notable for the following components:  ? Acetaminophen (Tylenol), Serum <10 (*)   ? All other components within normal limits  ?URINE DRUG SCREEN, QUALITATIVE ([facility] ONLY) - Abnormal; Notable for the following components:  ? Tricyclic, Ur Screen POSITIVE (*)   ? Cannabinoid 50 Ng, Ur [place] POSITIVE (*)   ? All other components within normal limits  ?RESP PANEL BY RT-PCR (FLU A&B, COVID) ARPGX2  ?ETHANOL  ?CBC  ?TSH  ?POC URINE PREG, ED  ?POC URINE PREG, ED  ? ? ? ?EKG ? ? ? ? ?RADIOLOGY ? ? ? ?PROCEDURES: ? ?Critical Care performed:  ? ?Procedures ? ? ?MEDICATIONS ORDERED IN ED: ?Medications - No data to display ? ? ?IMPRESSION / MDM / ASSESSMENT AND PLAN / ED COURSE  ?I reviewed the triage vital signs and the nursing notes. ? ?Patient with suicidal ideation will likely need admission.  I placed a psych consult. ? ?  ? ? ?FINAL CLINICAL IMPRESSION(S) / ED DIAGNOSES  ? ?Final diagnoses:  ?Anxiety  ?Suicidal ideation  ? ? ? ?  Rx / DC Orders  ? ?ED Discharge Orders   ? ? None  ? ?  ? ? ? ?Note:  This document was prepared using Dragon voice recognition software and may include unintentional dictation errors. ?  ?Arnaldo Natal, MD ?12/22/21 2325 ? ?

## 2021-12-22 NOTE — ED Notes (Signed)
Pt given dinner tray.

## 2021-12-23 DIAGNOSIS — F322 Major depressive disorder, single episode, severe without psychotic features: Secondary | ICD-10-CM | POA: Diagnosis not present

## 2021-12-23 MED ORDER — QUETIAPINE FUMARATE 200 MG PO TABS
600.0000 mg | ORAL_TABLET | Freq: Every day | ORAL | Status: DC
  Administered 2021-12-23: 600 mg via ORAL
  Filled 2021-12-23: qty 3

## 2021-12-23 MED ORDER — FLUOXETINE HCL 10 MG PO CAPS
10.0000 mg | ORAL_CAPSULE | Freq: Every day | ORAL | Status: DC
  Administered 2021-12-23 – 2021-12-24 (×2): 10 mg via ORAL
  Filled 2021-12-23 (×2): qty 1

## 2021-12-23 MED ORDER — HYDROXYZINE HCL 50 MG PO TABS
50.0000 mg | ORAL_TABLET | Freq: Four times a day (QID) | ORAL | Status: DC | PRN
  Administered 2021-12-23 (×2): 50 mg via ORAL
  Filled 2021-12-23 (×2): qty 1

## 2021-12-23 MED ORDER — GABAPENTIN 300 MG PO CAPS
300.0000 mg | ORAL_CAPSULE | Freq: Three times a day (TID) | ORAL | Status: DC
  Administered 2021-12-23 – 2021-12-24 (×3): 300 mg via ORAL
  Filled 2021-12-23 (×3): qty 1

## 2021-12-23 NOTE — BH IP Treatment Plan (Signed)
Interdisciplinary Treatment and Diagnostic Plan Update ? ?12/23/2021 ?Time of Session: 9:30AM ?Hannah Reid ?MRN: [id number] ? ?Principal Diagnosis: Suicidal ideation ? ?Secondary Diagnoses: Principal Problem: ?  Suicidal ideation ? ? ?Current Medications:  ?Current Facility-Administered Medications  ?Medication Dose Route Frequency Provider Last Rate Last Admin  ? acetaminophen (TYLENOL) tablet 650 mg  650 mg Oral Q6H PRN Sherlon Handing, NP      ? alum & mag hydroxide-simeth (MAALOX/MYLANTA) 200-200-20 MG/5ML suspension 30 mL  30 mL Oral Q4H PRN Waldon Merl F, NP      ? FLUoxetine (PROZAC) capsule 10 mg  10 mg Oral Daily Clapacs, John T, MD   10 mg at 12/23/21 1324  ? gabapentin (NEURONTIN) capsule 300 mg  300 mg Oral TID Clapacs, Madie Reno, MD      ? hydrOXYzine (ATARAX) tablet 50 mg  50 mg Oral Q6H PRN Clapacs, John T, MD   50 mg at 12/23/21 1025  ? magnesium hydroxide (MILK OF MAGNESIA) suspension 30 mL  30 mL Oral Daily PRN Waldon Merl F, NP      ? QUEtiapine (SEROQUEL) tablet 600 mg  600 mg Oral QHS Clapacs, Madie Reno, MD      ? ?PTA Medications: ?Medications Prior to Admission  ?Medication Sig Dispense Refill Last Dose  ? DULoxetine (CYMBALTA) 30 MG capsule Take 1 capsule (30 mg total) by mouth daily. (Patient not taking: Reported on 12/22/2021) 30 capsule 1   ? gabapentin (NEURONTIN) 100 MG capsule Take 1 capsule (100 mg total) by mouth 3 (three) times daily. (Patient not taking: Reported on 12/22/2021) 90 capsule 1   ? lisinopril (ZESTRIL) 5 MG tablet Take 1 tablet (5 mg total) by mouth daily. (Patient not taking: Reported on 12/22/2021) 30 tablet 1   ? QUEtiapine (SEROQUEL) 400 MG tablet Take 1 tablet (400 mg total) by mouth at bedtime. (Patient not taking: Reported on 12/22/2021) 30 tablet 1   ? ? ?Patient Stressors: Marital or family conflict   ?Medication change or noncompliance   ? ?Patient Strengths: Ability for insight  ?Motivation for treatment/growth  ? ?Treatment Modalities: Medication  Management, Group therapy, Case management,  ?1 to 1 session with clinician, Psychoeducation, Recreational therapy. ? ? ?Physician Treatment Plan for Primary Diagnosis: Suicidal ideation ?Long Term Goal(s):    ? ?Short Term Goals:   ? ?Medication Management: Evaluate patient's response, side effects, and tolerance of medication regimen. ? ?Therapeutic Interventions: 1 to 1 sessions, Unit Group sessions and Medication administration. ? ?Evaluation of Outcomes: Not Met ? ?Physician Treatment Plan for Secondary Diagnosis: Principal Problem: ?  Suicidal ideation ? ?Long Term Goal(s):    ? ?Short Term Goals:      ? ?Medication Management: Evaluate patient's response, side effects, and tolerance of medication regimen. ? ?Therapeutic Interventions: 1 to 1 sessions, Unit Group sessions and Medication administration. ? ?Evaluation of Outcomes: Not Met ? ? ?RN Treatment Plan for Primary Diagnosis: Suicidal ideation ?Long Term Goal(s): Knowledge of disease and therapeutic regimen to maintain health will improve ? ?Short Term Goals: Ability to demonstrate self-control, Ability to participate in decision making will improve, Ability to verbalize feelings will improve, Ability to disclose and discuss suicidal ideas, Ability to identify and develop effective coping behaviors will improve, and Compliance with prescribed medications will improve ? ?Medication Management: RN will administer medications as ordered by provider, will assess and evaluate patient's response and provide education to patient for prescribed medication. RN will report any adverse and/or side effects to prescribing provider. ? ?  Therapeutic Interventions: 1 on 1 counseling sessions, Psychoeducation, Medication administration, Evaluate responses to treatment, Monitor vital signs and CBGs as ordered, Perform/monitor CIWA, COWS, AIMS and Fall Risk screenings as ordered, Perform wound care treatments as ordered. ? ?Evaluation of Outcomes: Not Met ? ? ?LCSW  Treatment Plan for Primary Diagnosis: Suicidal ideation ?Long Term Goal(s): Safe transition to appropriate next level of care at discharge, Engage patient in therapeutic group addressing interpersonal concerns. ? ?Short Term Goals: Engage patient in aftercare planning with referrals and resources, Increase social support, Increase ability to appropriately verbalize feelings, Increase emotional regulation, Facilitate acceptance of mental health diagnosis and concerns, and Increase skills for wellness and recovery ? ?Therapeutic Interventions: Assess for all discharge needs, 1 to 1 time with Education officer, museum, Explore available resources and support systems, Assess for adequacy in community support network, Educate family and significant other(s) on suicide prevention, Complete Psychosocial Assessment, Interpersonal group therapy. ? ?Evaluation of Outcomes: Not Met ? ? ?Progress in Treatment: ?Attending groups: Yes. ?Participating in groups: Yes. ?Taking medication as prescribed: Yes. ?Toleration medication: Yes. ?Family/Significant other contact made: No, will contact:  once permission was given. ?Patient understands diagnosis: Yes. ?Discussing patient identified problems/goals with staff: Yes. ?Medical problems stabilized or resolved: Yes. ?Denies suicidal/homicidal ideation: Yes. ?Issues/concerns per patient self-inventory: No. ?Other: none ? ?New problem(s) identified: No, Describe:  none ? ?New Short Term/Long Term Goal(s):  elimination of symptoms of psychosis, medication management for mood stabilization; elimination of SI thoughts; development of comprehensive mental wellness/sobriety plan.  ? ?Patient Goals:  "try and change my medicine and get my medicine straight"  ? ?Discharge Plan or Barriers:  Patient reports plans to return to her home. Patient reports that she wants to begin with [facility] for outpatient therapy.  ? ?Reason for Continuation of Hospitalization: Anxiety ?Depression ?Medication  stabilization ? ?Estimated Length of Stay:  1-7 days ? ? ?Scribe for Treatment Team: ?Rozann Lesches, LCSW ?12/23/2021 ?2:14 PM ?

## 2021-12-23 NOTE — Progress Notes (Signed)
Patient calm and cooperative during assessment. Pt denies SI/HI/AVH. Pt stated to this writer that she just wanted to get her medications adjusted. Pt given education, support, and encouragement to be active in her treatment plan. Pt compliant with medication administration per MD orders. Pt being monitored Q 15 minutes for safety per unit protocol. Pt remains safe on the unit.  ?

## 2021-12-23 NOTE — Progress Notes (Signed)
Recreation Therapy Notes ? ?Date: 12/23/2021 ? ?Time: 11:00am  ? ?Location: Craft room   ? ?Behavioral response: N/A ?  ?Intervention Topic: Coping Skills  ? ?Discussion/Intervention: ?Patient refused to attend group.  ? ?Clinical Observations/Feedback:  ?Patient refused to attend group.  ?  ?[name] LRT/CTRS ? ? ? ? ? ? ? ?Hannah Reid ?12/23/2021 12:12 PM ?
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
## 2021-12-23 NOTE — [facility] Suicide Risk Assessment (Signed)
[facility] Admission Suicide Risk Assessment ? ? ?Nursing information obtained from:  Patient ?Demographic factors:  Caucasian, Gay, lesbian, or bisexual orientation, Low socioeconomic status, Unemployed ?Current Mental Status:  Self-harm thoughts ?Loss Factors:  NA ?Historical Factors:  Impulsivity ?Risk Reduction Factors:  Living with another person, especially a relative ? ?Total Time spent with patient: 1 hour ?Principal Problem: Major depressive disorder, single episode, severe without psychotic features (HCC) ?Diagnosis:  Principal Problem: ?  Major depressive disorder, single episode, severe without psychotic features (HCC) ?Active Problems: ?  Suicidal ideation ?  High blood pressure ? ?Subjective Data: Patient seen and chart reviewed.  41 year old woman with a history of past self injury and depression came to the hospital with depression and reports of suicidal ideation.  On interview with me today the patient absolutely denies any suicidal thoughts.  Denies psychosis.  Does report ongoing depression and wants medicine changes.  She is positive and has clear plans for the future. ? ?Continued Clinical Symptoms:  ?Alcohol Use Disorder Identification Test Final Score (AUDIT): 0 ?The "Alcohol Use Disorders Identification Test", Guidelines for Use in Primary Care, Second Edition.  World Science writer [facility]). ?Score between 0-7:  no or low risk or alcohol related problems. ?Score between 8-15:  moderate risk of alcohol related problems. ?Score between 16-19:  high risk of alcohol related problems. ?Score 20 or above:  warrants further diagnostic evaluation for alcohol dependence and treatment. ? ? ?CLINICAL FACTORS:  ? Depression:   Impulsivity ?Insomnia ? ? ?Musculoskeletal: ?Strength & Muscle Tone: within normal limits ?Gait & Station: normal ?Patient leans: N/A ? ?Psychiatric Specialty Exam: ? ?Presentation  ?General Appearance: Appropriate for Environment ? ?Eye Contact:Fair ? ?Speech:Clear and  Coherent ? ?Speech Volume:Normal ? ?Handedness:Right ? ? ?Mood and Affect  ?Mood:Depressed ? ?Affect:Congruent ? ? ?Thought Process  ?Thought Processes:Coherent ? ?Descriptions of Associations:Intact ? ?Orientation:Full (Time, Place and Person) ? ?Thought Content:Logical ? ?History of Schizophrenia/Schizoaffective disorder:No ? ?Duration of Psychotic Symptoms:N/A ? ?Hallucinations:Hallucinations: None ? ?Ideas of Reference:None ? ?Suicidal Thoughts:Suicidal Thoughts: Yes, Passive ?SI Passive Intent and/or Plan: Without Intent ? ?Homicidal Thoughts:Homicidal Thoughts: No ? ? ?Sensorium  ?Memory:Immediate Good ? ?Judgment:Fair ? ?Insight:Fair ? ? ?Executive Functions  ?Concentration:Fair ? ?Attention Span:Fair ? ?Recall:Fair ? ?Fund of Knowledge:Fair ? ?Language:Fair ? ? ?Psychomotor Activity  ?Psychomotor Activity:Psychomotor Activity: Normal ? ? ?Assets  ?Assets:Desire for Improvement; Housing; Resilience ? ? ?Sleep  ?Sleep:Sleep: Fair ? ? ? ?Physical Exam: ?Physical Exam ?Vitals and nursing note reviewed.  ?Constitutional:   ?   Appearance: Normal appearance.  ?HENT:  ?   Head: Normocephalic and atraumatic.  ?   Mouth/Throat:  ?   Pharynx: Oropharynx is clear.  ?Eyes:  ?   Pupils: Pupils are equal, round, and reactive to light.  ?Cardiovascular:  ?   Rate and Rhythm: Normal rate and regular rhythm.  ?Pulmonary:  ?   Effort: Pulmonary effort is normal.  ?   Breath sounds: Normal breath sounds.  ?Abdominal:  ?   General: Abdomen is flat.  ?   Palpations: Abdomen is soft.  ?Musculoskeletal:     ?   General: Normal range of motion.  ?Skin: ?   General: Skin is warm and dry.  ?Neurological:  ?   General: No focal deficit present.  ?   Mental Status: She is alert. Mental status is at baseline.  ?Psychiatric:     ?   Attention and Perception: Attention normal.     ?   Mood and Affect: Mood is  depressed.     ?   Speech: Speech is delayed.     ?   Behavior: Behavior is slowed.     ?   Thought Content: Thought content  normal. Thought content does not include suicidal ideation.  ? ?Review of Systems  ?Constitutional: Negative.   ?HENT: Negative.    ?Eyes: Negative.   ?Respiratory: Negative.    ?Cardiovascular: Negative.   ?Gastrointestinal: Negative.   ?Musculoskeletal: Negative.   ?Skin: Negative.   ?Neurological: Negative.   ?Psychiatric/Behavioral:  Positive for depression. Negative for hallucinations, substance abuse and suicidal ideas. The patient is nervous/anxious and has insomnia.   ?Blood pressure (!) 125/99, pulse 78, temperature 97.6 ?F (36.4 ?C), temperature source Oral, resp. rate 17, height 5\' 5"  (1.651 m), weight 107.5 kg, SpO2 99 %. Body mass index is 39.44 kg/m?. ? ? ?COGNITIVE FEATURES THAT CONTRIBUTE TO RISK:  ?Thought constriction (tunnel vision)   ? ?SUICIDE RISK:  ? Minimal: No identifiable suicidal ideation.  Patients presenting with no risk factors but with morbid ruminations; may be classified as minimal risk based on the severity of the depressive symptoms ? ?PLAN OF CARE: Continue 15-minute checks.  Adjust medication as discussed in the intake note.  Include in individual and group therapy.  Daily reassessment of dangerousness prior to discharge planning ? ?I certify that inpatient services furnished can reasonably be expected to improve the patient's condition.  ? ? , MD ?12/23/2021, 2:49 PM ? ?

## 2021-12-23 NOTE — Plan of Care (Signed)
Patient was getting anxious when her medicines delayed this morning. Vistaril helped her calm down. Patient visible in the milieu. Appropriate with staff & peers. Patient rated her depression 8/10. Denies SI,HI and AVH. Attended groups. Appetite and energy level good. ADLs maintained. Support and encouragement given. ?

## 2021-12-23 NOTE — Group Note (Signed)
[facility] LCSW Group Therapy Note ? ? ?Group Date: 12/23/2021 ?Start Time: 1300 ?End Time: 1400 ? ? ?Type of Therapy/Topic:  Group Therapy:  Emotion Regulation ? ?Participation Level:  Active  ? ?Description of Group:   ? The purpose of this group is to assist patients in learning to regulate negative emotions and experience positive emotions. Patients will be guided to discuss ways in which they have been vulnerable to their negative emotions. These vulnerabilities will be juxtaposed with experiences of positive emotions or situations, and patients challenged to use positive emotions to combat negative ones. Special emphasis will be placed on coping with negative emotions in conflict situations, and patients will process healthy conflict resolution skills. ? ?Therapeutic Goals: ?Patient will identify two positive emotions or experiences to reflect on in order to balance out negative emotions:  ?Patient will label two or more emotions that they find the most difficult to experience:  ?Patient will be able to demonstrate positive conflict resolution skills through discussion or role plays:  ? ?Summary of Patient Progress: ? ? ?Patient was present for the entirety of the group session. Patient was an active listener and participated in the topic of discussion, provided helpful advice to others, and added nuance to topic of conversation. Patient shared she has difficulty making good decisions, though does not provide any details. Patient provided positive affirmations to others in the group. ? ? ? ?Therapeutic Modalities:   ?Cognitive Behavioral Therapy ?Feelings Identification ?Dialectical Behavioral Therapy ? ? ?Corky Crafts, LCSWA ?

## 2021-12-23 NOTE — [facility] Suicide Risk Assessment (Signed)
[facility] INPATIENT:  Family/Significant Other Suicide Prevention Education ? ?Suicide Prevention Education:  ?Patient Refusal for Family/Significant Other Suicide Prevention Education: The patient Hannah Reid has refused to provide written consent for family/significant other to be provided Family/Significant Other Suicide Prevention Education during admission and/or prior to discharge.  Physician notified. ?SPE completed with pt, as pt refused to consent to family contact. SPI pamphlet provided to pt and pt was encouraged to share information with support network, ask questions, and talk about any concerns relating to SPE. Pt denies access to guns/firearms and verbalized understanding of information provided. Mobile Crisis information also provided to pt.  ? ? ?Harden Mo ?12/23/2021, 11:43 AM ?

## 2021-12-23 NOTE — H&P (Signed)
Psychiatric Admission Assessment Adult  Patient Identification: Hannah Reid MRN:  [id number] Date of Evaluation:  12/23/2021 Chief Complaint:  Suicidal ideation [R45.851] Principal Diagnosis: Major depressive disorder, single episode, severe without psychotic features (HCC) Diagnosis:  Principal Problem:   Major depressive disorder, single episode, severe without psychotic features (HCC) Active Problems:   Suicidal ideation   High blood pressure  History of Present Illness: Patient seen and chart reviewed.  41 year old woman with a history of recurrent depression who was just discharged from the hospital a couple months ago.  Patient states after her last discharge she tried to go to [facility] for outpatient treatment but was dissatisfied with the care and claims that she was never set up with a proper provider.  She did not get refills on her medicine.  Most recently she has been taking Seroquel 2 of them at night for a total of 800 mg every now and then to try and sleep.  Also taking the gabapentin just once a day but all 3.  Said that she has continued to take the Cymbalta but feels it is not working.  Mood has been depressed down and negative anxious.  Passive suicidal thoughts at times with no intention or plan.  Says that she had been staying off of alcohol and drugs other than cannabis use.  Her court situation which was a major life stress was resolved by being dismissed yesterday Associated Signs/Symptoms: Depression Symptoms:  depressed mood, insomnia, difficulty concentrating, anxiety, Duration of Depression Symptoms: Greater than two weeks  (Hypo) Manic Symptoms:  Impulsivity, Anxiety Symptoms:  Excessive Worry, Psychotic Symptoms:   Denies any PTSD Symptoms: Hypervigilance:  Yes Hyperarousal:  Irritability/Anger Total Time spent with patient: 1 hour  Past Psychiatric History: Past history of recurrent anxiety and depression  Is the patient at risk to self? Yes.    Has  the patient been a risk to self in the past 6 months? Yes.    Has the patient been a risk to self within the distant past? Yes.    Is the patient a risk to others? No.  Has the patient been a risk to others in the past 6 months? No.  Has the patient been a risk to others within the distant past? No.   Prior Inpatient Therapy:   Prior Outpatient Therapy:    Alcohol Screening: 1. How often do you have a drink containing alcohol?: Never 2. How many drinks containing alcohol do you have on a typical day when you are drinking?: 1 or 2 3. How often do you have six or more drinks on one occasion?: Never AUDIT-C Score: 0 4. How often during the last year have you found that you were not able to stop drinking once you had started?: Never 5. How often during the last year have you failed to do what was normally expected from you because of drinking?: Never 6. How often during the last year have you needed a first drink in the morning to get yourself going after a heavy drinking session?: Never 7. How often during the last year have you had a feeling of guilt of remorse after drinking?: Never 8. How often during the last year have you been unable to remember what happened the night before because you had been drinking?: Never 9. Have you or someone else been injured as a result of your drinking?: No 10. Has a relative or friend or a doctor or another health worker been concerned about your drinking or suggested  you cut down?: No Alcohol Use Disorder Identification Test Final Score (AUDIT): 0 Substance Abuse History in the last 12 months:  Yes.   Consequences of Substance Abuse: Worsening of mood problems Previous Psychotropic Medications: Yes  Psychological Evaluations: Yes  Past Medical History: History reviewed. No pertinent past medical history.  Past Surgical History:  Procedure Laterality Date   ABDOMINAL HYSTERECTOMY     CHOLECYSTECTOMY     kidney tumor removal     Family History: History  reviewed. No pertinent family history. Family Psychiatric  History: See previous Tobacco Screening:   Social History:  Social History   Substance and Sexual Activity  Alcohol Use Yes   Comment: socially     Social History   Substance and Sexual Activity  Drug Use Not Currently    Additional Social History:                           Allergies:  No Known Allergies Lab Results:  Results for orders placed or performed during the hospital encounter of 12/22/21 (from the past 48 hour(s))  POC urine preg, ED (not at [facility])     Status: None   Collection Time: 12/22/21  2:49 PM  Result Value Ref Range   Preg Test, Ur NEGATIVE NEGATIVE    Comment:        THE SENSITIVITY OF THIS METHODOLOGY IS >24 mIU/mL   Comprehensive metabolic panel     Status: Abnormal   Collection Time: 12/22/21  2:56 PM  Result Value Ref Range   Sodium 138 135 - 145 mmol/L   Potassium 3.8 3.5 - 5.1 mmol/L   Chloride 105 98 - 111 mmol/L   CO2 25 22 - 32 mmol/L   Glucose, Bld 104 (H) 70 - 99 mg/dL    Comment: Glucose reference range applies only to samples taken after fasting for at least 8 hours.   BUN 15 6 - 20 mg/dL   Creatinine, Ser 1.61 0.44 - 1.00 mg/dL   Calcium 9.0 8.9 - 09.6 mg/dL   Total Protein 7.3 6.5 - 8.1 g/dL   Albumin 4.2 3.5 - 5.0 g/dL   AST 19 15 - 41 U/L   ALT 17 0 - 44 U/L   Alkaline Phosphatase 67 38 - 126 U/L   Total Bilirubin 0.6 0.3 - 1.2 mg/dL   GFR, Estimated >04 >54 mL/min    Comment: (NOTE) Calculated using the CKD-EPI Creatinine Equation (2021)    Anion gap 8 5 - 15    Comment: Performed at [facility], [address] [place], [place] [address]  Ethanol     Status: None   Collection Time: 12/22/21  2:56 PM  Result Value Ref Range   Alcohol, Ethyl (B) <10 <10 mg/dL    Comment: (NOTE) Lowest detectable limit for serum alcohol is 10 mg/dL.  For medical purposes only. Performed at [facility], [address] [place], [place] [address]    Salicylate level     Status: Abnormal   Collection Time: 12/22/21  2:56 PM  Result Value Ref Range   Salicylate Lvl <7.0 (L) 7.0 - 30.0 mg/dL    Comment: Performed at [facility], [address] [place], [place] [address]  Acetaminophen level     Status: Abnormal   Collection Time: 12/22/21  2:56 PM  Result Value Ref Range   Acetaminophen (Tylenol), Serum <10 (L) 10 - 30 ug/mL    Comment: (NOTE) Therapeutic concentrations  vary significantly. A range of 10-30 ug/mL  may be an effective concentration for many patients. However, some  are best treated at concentrations outside of this range. Acetaminophen concentrations >150 ug/mL at 4 hours after ingestion  and >50 ug/mL at 12 hours after ingestion are often associated with  toxic reactions.  Performed at [facility], [address] [place], [place] [address]   cbc     Status: None   Collection Time: 12/22/21  2:56 PM  Result Value Ref Range   WBC 6.2 4.0 - 10.5 K/uL   RBC 4.65 3.87 - 5.11 MIL/uL   Hemoglobin 14.4 12.0 - 15.0 g/dL   HCT 81.8 29.9 - 37.1 %   MCV 89.0 80.0 - 100.0 fL   MCH 31.0 26.0 - 34.0 pg   MCHC 34.8 30.0 - 36.0 g/dL   RDW 69.6 78.9 - 38.1 %   Platelets 325 150 - 400 K/uL   nRBC 0.0 0.0 - 0.2 %    Comment: Performed at [facility], [address] [place], [place] [address]  Urine Drug Screen, Qualitative     Status: Abnormal   Collection Time: 12/22/21  2:56 PM  Result Value Ref Range   Tricyclic, Ur Screen POSITIVE (A) NONE DETECTED   Amphetamines, Ur Screen NONE DETECTED NONE DETECTED   MDMA (Ecstasy)Ur Screen NONE DETECTED NONE DETECTED   Cocaine Metabolite,Ur [place] NONE DETECTED NONE DETECTED   Opiate, Ur Screen NONE DETECTED NONE DETECTED   Phencyclidine (PCP) Ur S NONE DETECTED NONE DETECTED   Cannabinoid 50 Ng, Ur [place] POSITIVE (A) NONE DETECTED   Barbiturates, Ur Screen NONE DETECTED NONE DETECTED   Benzodiazepine, Ur Scrn NONE DETECTED NONE DETECTED   Methadone  Scn, Ur NONE DETECTED NONE DETECTED    Comment: (NOTE) Tricyclics + metabolites, urine    Cutoff 1000 ng/mL Amphetamines + metabolites, urine  Cutoff 1000 ng/mL MDMA (Ecstasy), urine              Cutoff 500 ng/mL Cocaine Metabolite, urine          Cutoff 300 ng/mL Opiate + metabolites, urine        Cutoff 300 ng/mL Phencyclidine (PCP), urine         Cutoff 25 ng/mL Cannabinoid, urine                 Cutoff 50 ng/mL Barbiturates + metabolites, urine  Cutoff 200 ng/mL Benzodiazepine, urine              Cutoff 200 ng/mL Methadone, urine                   Cutoff 300 ng/mL  The urine drug screen provides only a preliminary, unconfirmed analytical test result and should not be used for non-medical purposes. Clinical consideration and professional judgment should be applied to any positive drug screen result due to possible interfering substances. A more specific alternate chemical method must be used in order to obtain a confirmed analytical result. Gas chromatography / mass spectrometry (GC/MS) is the preferred confirm atory method. Performed at [facility], [address] [place], [place] [address]   Resp Panel by RT-PCR (Flu A&B, Covid) Nasopharyngeal Swab     Status: None   Collection Time: 12/22/21  3:31 PM   Specimen: Nasopharyngeal Swab; Nasopharyngeal(NP) swabs in vial transport medium  Result Value Ref Range   SARS Coronavirus 2 by RT PCR NEGATIVE NEGATIVE    Comment: (NOTE) SARS-CoV-2 target nucleic acids are NOT  DETECTED.  The SARS-CoV-2 RNA is generally detectable in upper respiratory specimens during the acute phase of infection. The lowest concentration of SARS-CoV-2 viral copies this assay can detect is 138 copies/mL. A negative result does not preclude SARS-Cov-2 infection and should not be used as the sole basis for treatment or other patient management decisions. A negative result may occur with  improper specimen collection/handling, submission of  specimen other than nasopharyngeal swab, presence of viral mutation(s) within the areas targeted by this assay, and inadequate number of viral copies(<138 copies/mL). A negative result must be combined with clinical observations, patient history, and epidemiological information. The expected result is Negative.  Fact Sheet for Patients:  BloggerCourse.com  Fact Sheet for Healthcare Providers:  SeriousBroker.it  This test is no t yet approved or cleared by the [place] FDA and  has been authorized for detection and/or diagnosis of SARS-CoV-2 by FDA under an Emergency Use Authorization (EUA). This EUA will remain  in effect (meaning this test can be used) for the duration of the COVID-19 declaration under Section 564(b)(1) of the Act, 21 U.S.C.section 360bbb-3(b)(1), unless the authorization is terminated  or revoked sooner.       Influenza A by PCR NEGATIVE NEGATIVE   Influenza B by PCR NEGATIVE NEGATIVE    Comment: (NOTE) The Xpert Xpress SARS-CoV-2/FLU/RSV plus assay is intended as an aid in the diagnosis of influenza from Nasopharyngeal swab specimens and should not be used as a sole basis for treatment. Nasal washings and aspirates are unacceptable for Xpert Xpress SARS-CoV-2/FLU/RSV testing.  Fact Sheet for Patients: BloggerCourse.com  Fact Sheet for Healthcare Providers: SeriousBroker.it  This test is not yet approved or cleared by the [place] FDA and has been authorized for detection and/or diagnosis of SARS-CoV-2 by FDA under an Emergency Use Authorization (EUA). This EUA will remain in effect (meaning this test can be used) for the duration of the COVID-19 declaration under Section 564(b)(1) of the Act, 21 U.S.C. section 360bbb-3(b)(1), unless the authorization is terminated or revoked.  Performed at [facility], [address]  [place], [place] [address]   TSH     Status: None   Collection Time: 12/22/21  3:33 PM  Result Value Ref Range   TSH 0.735 0.350 - 4.500 uIU/mL    Comment: Performed by a 3rd Generation assay with a functional sensitivity of <=0.01 uIU/mL. Performed at [facility], [address] [place], [place] [address]     Blood Alcohol level:  Lab Results  Component Value Date   [facility] <10 12/22/2021   ETH <10 10/16/2021    Metabolic Disorder Labs:  Lab Results  Component Value Date   HGBA1C 5.1 10/19/2021   MPG 100 10/19/2021   No results found for: PROLACTIN Lab Results  Component Value Date   CHOL 260 (H) 10/19/2021   TRIG 241 (H) 10/19/2021   HDL 42 10/19/2021   CHOLHDL 6.2 10/19/2021   VLDL 48 (H) 10/19/2021   LDLCALC 170 (H) 10/19/2021    Current Medications: Current Facility-Administered Medications  Medication Dose Route Frequency Provider Last Rate Last Admin   acetaminophen (TYLENOL) tablet 650 mg  650 mg Oral Q6H PRN Vanetta Mulders, NP       alum & mag hydroxide-simeth (MAALOX/MYLANTA) 200-200-20 MG/5ML suspension 30 mL  30 mL Oral Q4H PRN Gabriel Cirri F, NP       FLUoxetine (PROZAC) capsule 10 mg  10 mg Oral Daily Jamespaul Secrist, Jackquline Denmark, MD   10 mg at 12/23/21  1324   gabapentin (NEURONTIN) capsule 300 mg  300 mg Oral TID Mry Lamia, Jackquline Denmark, MD       hydrOXYzine (ATARAX) tablet 50 mg  50 mg Oral Q6H PRN Sovereign Ramiro, Jackquline Denmark, MD   50 mg at 12/23/21 1025   magnesium hydroxide (MILK OF MAGNESIA) suspension 30 mL  30 mL Oral Daily PRN Vanetta Mulders, NP       QUEtiapine (SEROQUEL) tablet 600 mg  600 mg Oral QHS Paxton Kanaan T, MD       PTA Medications: Medications Prior to Admission  Medication Sig Dispense Refill Last Dose   DULoxetine (CYMBALTA) 30 MG capsule Take 1 capsule (30 mg total) by mouth daily. (Patient not taking: Reported on 12/22/2021) 30 capsule 1    gabapentin (NEURONTIN) 100 MG capsule Take 1 capsule (100 mg total) by mouth 3 (three) times daily.  (Patient not taking: Reported on 12/22/2021) 90 capsule 1    lisinopril (ZESTRIL) 5 MG tablet Take 1 tablet (5 mg total) by mouth daily. (Patient not taking: Reported on 12/22/2021) 30 tablet 1    QUEtiapine (SEROQUEL) 400 MG tablet Take 1 tablet (400 mg total) by mouth at bedtime. (Patient not taking: Reported on 12/22/2021) 30 tablet 1     Musculoskeletal: Strength & Muscle Tone: within normal limits Gait & Station: normal Patient leans: N/A            Psychiatric Specialty Exam:  Presentation  General Appearance: Appropriate for Environment  Eye Contact:Fair  Speech:Clear and Coherent  Speech Volume:Normal  Handedness:Right   Mood and Affect  Mood:Depressed  Affect:Congruent   Thought Process  Thought Processes:Coherent  Duration of Psychotic Symptoms: N/A  Past Diagnosis of Schizophrenia or Psychoactive disorder: No  Descriptions of Associations:Intact  Orientation:Full (Time, Place and Person)  Thought Content:Logical  Hallucinations:Hallucinations: None  Ideas of Reference:None  Suicidal Thoughts:Suicidal Thoughts: Yes, Passive SI Passive Intent and/or Plan: Without Intent  Homicidal Thoughts:Homicidal Thoughts: No   Sensorium  Memory:Immediate Good  Judgment:Fair  Insight:Fair   Executive Functions  Concentration:Fair  Attention Span:Fair  Recall:Fair  Fund of Knowledge:Fair  Language:Fair   Psychomotor Activity  Psychomotor Activity:Psychomotor Activity: Normal   Assets  Assets:Desire for Improvement; Housing; Resilience   Sleep  Sleep:Sleep: Fair    Physical Exam: Physical Exam Vitals and nursing note reviewed.  Constitutional:      Appearance: Normal appearance.  HENT:     Head: Normocephalic and atraumatic.     Mouth/Throat:     Pharynx: Oropharynx is clear.  Eyes:     Pupils: Pupils are equal, round, and reactive to light.  Cardiovascular:     Rate and Rhythm: Normal rate and regular rhythm.   Pulmonary:     Effort: Pulmonary effort is normal.     Breath sounds: Normal breath sounds.  Abdominal:     General: Abdomen is flat.     Palpations: Abdomen is soft.  Musculoskeletal:        General: Normal range of motion.  Skin:    General: Skin is warm and dry.  Neurological:     General: No focal deficit present.     Mental Status: She is alert. Mental status is at baseline.  Psychiatric:        Attention and Perception: Attention normal.        Mood and Affect: Mood is anxious and depressed.        Speech: Speech normal.        Behavior: Behavior is cooperative.  Thought Content: Thought content normal.        Cognition and Memory: Cognition normal.        Judgment: Judgment normal.   Review of Systems  Constitutional: Negative.   HENT: Negative.    Eyes: Negative.   Respiratory: Negative.    Cardiovascular: Negative.   Gastrointestinal: Negative.   Musculoskeletal: Negative.   Skin: Negative.   Neurological: Negative.   Psychiatric/Behavioral:  Positive for depression. Negative for hallucinations, substance abuse and suicidal ideas. The patient is nervous/anxious and has insomnia.   Blood pressure (!) 125/99, pulse 78, temperature 97.6 F (36.4 C), temperature source Oral, resp. rate 17, height 5\' 5"  (1.651 m), weight 107.5 kg, SpO2 99 %. Body mass index is 39.44 kg/m.  Treatment Plan Summary: Medication management and Plan patient and I discussed medication management.  She very much would like her medicines changed.  She is still not sleeping all that well and feels irritable and depressed but without any current suicidal thought.  She likes the Seroquel and I agreed to go up to 600 mg at night.  I suggest replacing the Cymbalta with fluoxetine 20 mg a day.  She did have agitation in the past with Zoloft but nothing like that with the Cymbalta.  I suggest we go up to 300 mg 3 times a day of the gabapentin and since that does not make her tired and seems to help  with the anxiety.  Including groups.  Work on follow-up at our [facility]  Observation Level/Precautions:  15 minute checks  Laboratory:  UDS  Psychotherapy:    Medications:    Consultations:    Discharge Concerns:    Estimated LOS:  Other:     Physician Treatment Plan for Primary Diagnosis: Major depressive disorder, single episode, severe without psychotic features (HCC) Long Term Goal(s): Improvement in symptoms so as ready for discharge  Short Term Goals: Ability to verbalize feelings will improve, Ability to disclose and discuss suicidal ideas, and Ability to demonstrate self-control will improve  Physician Treatment Plan for Secondary Diagnosis: Principal Problem:   Major depressive disorder, single episode, severe without psychotic features (HCC) Active Problems:   Suicidal ideation   High blood pressure  Long Term Goal(s): Improvement in symptoms so as ready for discharge  Short Term Goals: Compliance with prescribed medications will improve  I certify that inpatient services furnished can reasonably be expected to improve the patient's condition.    Mordecai Rasmussen, MD 3/29/20232:24 PM

## 2021-12-24 ENCOUNTER — Other Ambulatory Visit: Payer: Self-pay

## 2021-12-24 MED ORDER — QUETIAPINE FUMARATE 300 MG PO TABS
600.0000 mg | ORAL_TABLET | Freq: Every day | ORAL | 0 refills | Status: DC
  Filled 2021-12-24: qty 20, 10d supply, fill #0

## 2021-12-24 MED ORDER — FLUOXETINE HCL 10 MG PO CAPS: 10.0000 mg | ORAL_CAPSULE | Freq: Every day | ORAL | 1 refills | Status: DC

## 2021-12-24 MED ORDER — HYDROXYZINE HCL 50 MG PO TABS: 50.0000 mg | ORAL_TABLET | Freq: Four times a day (QID) | ORAL | 1 refills | Status: DC | PRN

## 2021-12-24 MED ORDER — QUETIAPINE FUMARATE 300 MG PO TABS: 600.0000 mg | ORAL_TABLET | Freq: Every day | ORAL | 1 refills | Status: DC

## 2021-12-24 MED ORDER — LISINOPRIL 5 MG PO TABS
5.0000 mg | ORAL_TABLET | Freq: Every day | ORAL | Status: DC
  Administered 2021-12-24: 5 mg via ORAL
  Filled 2021-12-24: qty 1

## 2021-12-24 MED ORDER — FLUOXETINE HCL 10 MG PO CAPS
10.0000 mg | ORAL_CAPSULE | Freq: Every day | ORAL | 0 refills | Status: DC
  Filled 2021-12-24: qty 10, 10d supply, fill #0

## 2021-12-24 MED ORDER — LISINOPRIL 5 MG PO TABS: 5.0000 mg | ORAL_TABLET | Freq: Every day | ORAL | 1 refills | Status: DC

## 2021-12-24 MED ORDER — GABAPENTIN 300 MG PO CAPS: 300.0000 mg | ORAL_CAPSULE | Freq: Three times a day (TID) | ORAL | 1 refills | Status: DC

## 2021-12-24 MED ORDER — LISINOPRIL 5 MG PO TABS
5.0000 mg | ORAL_TABLET | Freq: Every day | ORAL | 0 refills | Status: DC
  Filled 2021-12-24: qty 10, 10d supply, fill #0

## 2021-12-24 MED ORDER — HYDROXYZINE HCL 50 MG PO TABS
50.0000 mg | ORAL_TABLET | Freq: Four times a day (QID) | ORAL | 0 refills | Status: DC | PRN
  Filled 2021-12-24: qty 20, 5d supply, fill #0

## 2021-12-24 MED ORDER — GABAPENTIN 300 MG PO CAPS
300.0000 mg | ORAL_CAPSULE | Freq: Three times a day (TID) | ORAL | 0 refills | Status: DC
Start: 2021-12-24 — End: 2022-01-01
  Filled 2021-12-24: qty 30, 10d supply, fill #0

## 2021-12-24 NOTE — [facility] Suicide Risk Assessment (Signed)
[facility] Discharge Suicide Risk Assessment ? ? ?Principal Problem: Major depressive disorder, single episode, severe without psychotic features (HCC) ?Discharge Diagnoses: Principal Problem: ?  Major depressive disorder, single episode, severe without psychotic features (HCC) ?Active Problems: ?  Suicidal ideation ?  High blood pressure ? ? ?Total Time spent with patient: 30 minutes ? ?Musculoskeletal: ?Strength & Muscle Tone: within normal limits ?Gait & Station: normal ?Patient leans: N/A ? ?Psychiatric Specialty Exam ? ?Presentation  ?General Appearance: Appropriate for Environment ? ?Eye Contact:Fair ? ?Speech:Clear and Coherent ? ?Speech Volume:Normal ? ?Handedness:Right ? ? ?Mood and Affect  ?Mood:Depressed ? ?Duration of Depression Symptoms: Greater than two weeks ? ?Affect:Congruent ? ? ?Thought Process  ?Thought Processes:Coherent ? ?Descriptions of Associations:Intact ? ?Orientation:Full (Time, Place and Person) ? ?Thought Content:Logical ? ?History of Schizophrenia/Schizoaffective disorder:No ? ?Duration of Psychotic Symptoms:N/A ? ?Hallucinations:No data recorded ?Ideas of Reference:None ? ?Suicidal Thoughts:No data recorded ?Homicidal Thoughts:No data recorded ? ?Sensorium  ?Memory:Immediate Good ? ?Judgment:Fair ? ?Insight:Fair ? ? ?Executive Functions  ?Concentration:Fair ? ?Attention Span:Fair ? ?Recall:Fair ? ?Fund of Knowledge:Fair ? ?Language:Fair ? ? ?Psychomotor Activity  ?Psychomotor Activity:No data recorded ? ?Assets  ?Assets:Desire for Improvement; Housing; Resilience ? ? ?Sleep  ?Sleep:No data recorded ? ?Physical Exam: ?Physical Exam ?Vitals and nursing note reviewed.  ?Constitutional:   ?   Appearance: Normal appearance.  ?HENT:  ?   Head: Normocephalic and atraumatic.  ?   Mouth/Throat:  ?   Pharynx: Oropharynx is clear.  ?Eyes:  ?   Pupils: Pupils are equal, round, and reactive to light.  ?Cardiovascular:  ?   Rate and Rhythm: Normal rate and regular rhythm.  ?Pulmonary:  ?   Effort:  Pulmonary effort is normal.  ?   Breath sounds: Normal breath sounds.  ?Abdominal:  ?   General: Abdomen is flat.  ?   Palpations: Abdomen is soft.  ?Musculoskeletal:     ?   General: Normal range of motion.  ?Skin: ?   General: Skin is warm and dry.  ?Neurological:  ?   General: No focal deficit present.  ?   Mental Status: She is alert. Mental status is at baseline.  ?Psychiatric:     ?   Attention and Perception: Attention normal.     ?   Mood and Affect: Mood normal.     ?   Speech: Speech normal.     ?   Behavior: Behavior is cooperative.     ?   Thought Content: Thought content normal.     ?   Cognition and Memory: Cognition normal.     ?   Judgment: Judgment normal.  ? ?Review of Systems  ?Constitutional: Negative.   ?HENT: Negative.    ?Eyes: Negative.   ?Respiratory: Negative.    ?Cardiovascular: Negative.   ?Gastrointestinal: Negative.   ?Musculoskeletal: Negative.   ?Skin: Negative.   ?Neurological: Negative.   ?Psychiatric/Behavioral: Negative.    ?Blood pressure (!) 142/115, pulse 75, temperature 98 ?F (36.7 ?C), temperature source Oral, resp. rate 17, height 5\' 5"  (1.651 m), weight 107.5 kg, SpO2 99 %. Body mass index is 39.44 kg/m?. ? ?Mental Status Per Nursing Assessment::   ?On Admission:  Self-harm thoughts ? ?Demographic Factors:  ?NA ? ?Loss Factors: ?NA ? ?Historical Factors: ?Prior suicide attempts and Impulsivity ? ?Risk Reduction Factors:   ?Positive social support and Positive therapeutic relationship ? ?Continued Clinical Symptoms:  ?Bipolar Disorder:   Mixed State ?Depression:   Impulsivity ? ?Cognitive Features That Contribute To Risk:  ?  None   ? ?Suicide Risk:  ?Minimal: No identifiable suicidal ideation.  Patients presenting with no risk factors but with morbid ruminations; may be classified as minimal risk based on the severity of the depressive symptoms ? ? ? ?Plan Of Care/Follow-up recommendations:  ? ?Patient is currently showing an upbeat affect with positive plans for the future.   She is expressing optimism about treatment.  She is tolerating medications and agrees to continue medication treatment.  She agrees to follow-up at [facility].  Prescriptions and supply of medicine given at discharge.  Patient completely denies any suicidal ideation. ?Mordecai Rasmussen, MD ?12/24/2021, 10:13 AM ?

## 2021-12-24 NOTE — Discharge Summary (Signed)
Physician Discharge Summary Note  Patient:  Hannah Reid is an 41 y.o., female MRN:  [id number] DOB:  1981-06-09 Patient phone:  [phone] (home)  Patient address:   [address] [place] [address],  Total Time spent with patient: 30 minutes  Date of Admission:  12/22/2021 Date of Discharge: December 24, 2021  Reason for Admission: Patient was admitted through the emergency room after presenting with some suicidal ideation and return of depressive and anxious symptoms request for hospitalization.  Principal Problem: Major depressive disorder, single episode, severe without psychotic features [facility]) Discharge Diagnoses: Principal Problem:   Major depressive disorder, single episode, severe without psychotic features (HCC) Active Problems:   Suicidal ideation   High blood pressure   Past Psychiatric History: Past history of recurrent depression and irritability mood swings.  Also history of substance abuse.  History of suicidal ideation and self endangering behavior  Past Medical History: History reviewed. No pertinent past medical history.  Past Surgical History:  Procedure Laterality Date   ABDOMINAL HYSTERECTOMY     CHOLECYSTECTOMY     kidney tumor removal     Family History: History reviewed. No pertinent family history. Family Psychiatric  History: See previous Social History:  Social History   Substance and Sexual Activity  Alcohol Use Yes   Comment: socially     Social History   Substance and Sexual Activity  Drug Use Not Currently    Social History   Socioeconomic History   Marital status: Single    Spouse name: Not on file   Number of children: Not on file   Years of education: Not on file   Highest education level: Not on file  Occupational History   Not on file  Tobacco Use   Smoking status: Every Day    Packs/day: 0.50    Types: Cigarettes   Smokeless tobacco: Never  Vaping Use   Vaping Use: Some days  Substance and Sexual  Activity   Alcohol use: Yes    Comment: socially   Drug use: Not Currently   Sexual activity: Not on file  Other Topics Concern   Not on file  Social History Narrative   Not on file   Social Determinants of Health   Financial Resource Strain: Not on file  Food Insecurity: Not on file  Transportation Needs: Not on file  Physical Activity: Not on file  Stress: Not on file  Social Connections: Not on file    Hospital Course: Patient admitted to psychiatric unit.  Her behavior was appropriate and calm throughout her time on the unit.  15-minute checks were maintained.  Patient engaged appropriately with individual members of the treatment team and the treatment team as a whole.  Medications were altered by increasing Seroquel to 600 mg and switching her antidepressant to a starting dose of fluoxetine at 10 mg a day.  At the time of discharge patient is showing a positive upbeat mood and is optimistic about the future.  There is no evidence of manic or psychotic presentation.  She denies any suicidal thoughts.  She is requesting discharge and agrees to follow up with the local mental health agency.  Prescriptions and 10-day supply are provided.  Physical Findings: AIMS:  , ,  ,  ,    CIWA:    COWS:     Musculoskeletal: Strength & Muscle Tone: within normal limits Gait & Station: normal Patient leans: N/A   Psychiatric Specialty Exam:  Presentation  General Appearance: Appropriate  for Environment  Eye Contact:Fair  Speech:Clear and Coherent  Speech Volume:Normal  Handedness:Right   Mood and Affect  Mood:Depressed  Affect:Congruent   Thought Process  Thought Processes:Coherent  Descriptions of Associations:Intact  Orientation:Full (Time, Place and Person)  Thought Content:Logical  History of Schizophrenia/Schizoaffective disorder:No  Duration of Psychotic Symptoms:N/A  Hallucinations:No data recorded Ideas of Reference:None  Suicidal Thoughts:No data  recorded Homicidal Thoughts:No data recorded  Sensorium  Memory:Immediate Good  Judgment:Fair  Insight:Fair   Executive Functions  Concentration:Fair  Attention Span:Fair  Recall:Fair  Fund of Knowledge:Fair  Language:Fair   Psychomotor Activity  Psychomotor Activity:No data recorded  Assets  Assets:Desire for Improvement; Housing; Resilience   Sleep  Sleep:No data recorded   Physical Exam: Physical Exam Vitals and nursing note reviewed.  Constitutional:      Appearance: Normal appearance.  HENT:     Head: Normocephalic and atraumatic.     Mouth/Throat:     Pharynx: Oropharynx is clear.  Eyes:     Pupils: Pupils are equal, round, and reactive to light.  Cardiovascular:     Rate and Rhythm: Normal rate and regular rhythm.  Pulmonary:     Effort: Pulmonary effort is normal.     Breath sounds: Normal breath sounds.  Abdominal:     General: Abdomen is flat.     Palpations: Abdomen is soft.  Musculoskeletal:        General: Normal range of motion.  Skin:    General: Skin is warm and dry.  Neurological:     General: No focal deficit present.     Mental Status: She is alert. Mental status is at baseline.  Psychiatric:        Attention and Perception: Attention normal.        Mood and Affect: Mood normal.        Speech: Speech normal.        Behavior: Behavior is cooperative.        Thought Content: Thought content normal.        Cognition and Memory: Cognition normal.        Judgment: Judgment normal.   Review of Systems  Constitutional: Negative.   HENT: Negative.    Eyes: Negative.   Respiratory: Negative.    Cardiovascular: Negative.   Gastrointestinal: Negative.   Musculoskeletal: Negative.   Skin: Negative.   Neurological: Negative.   Psychiatric/Behavioral: Negative.    Blood pressure (!) 142/115, pulse 75, temperature 98 F (36.7 C), temperature source Oral, resp. rate 17, height 5\' 5"  (1.651 m), weight 107.5 kg, SpO2 99 %. Body mass  index is 39.44 kg/m.   Social History   Tobacco Use  Smoking Status Every Day   Packs/day: 0.50   Types: Cigarettes  Smokeless Tobacco Never   Tobacco Cessation:  A prescription for an FDA-approved tobacco cessation medication was offered at discharge and the patient refused   Blood Alcohol level:  Lab Results  Component Value Date   [facility] <10 12/22/2021   ETH <10 10/16/2021    Metabolic Disorder Labs:  Lab Results  Component Value Date   HGBA1C 5.1 10/19/2021   MPG 100 10/19/2021   No results found for: PROLACTIN Lab Results  Component Value Date   CHOL 260 (H) 10/19/2021   TRIG 241 (H) 10/19/2021   HDL 42 10/19/2021   CHOLHDL 6.2 10/19/2021   VLDL 48 (H) 10/19/2021   LDLCALC 170 (H) 10/19/2021    See Psychiatric Specialty Exam and Suicide Risk Assessment completed by Attending Physician prior  to discharge.  Discharge destination:  Home  Is patient on multiple antipsychotic therapies at discharge:  No   Has Patient had three or more failed trials of antipsychotic monotherapy by history:  No  Recommended Plan for Multiple Antipsychotic Therapies: NA  Discharge Instructions     Diet - low sodium heart healthy   Complete by: As directed    Increase activity slowly   Complete by: As directed       Allergies as of 12/24/2021   No Known Allergies      Medication List     STOP taking these medications    DULoxetine 30 MG capsule Commonly known as: CYMBALTA       TAKE these medications      Indication  FLUoxetine 10 MG capsule Commonly known as: PROZAC Take 1 capsule (10 mg total) by mouth daily. Start taking on: December 25, 2021  Indication: Depression   gabapentin 300 MG capsule Commonly known as: NEURONTIN Take 1 capsule (300 mg total) by mouth 3 (three) times daily. What changed:  medication strength how much to take  Indication: Anxiety   hydrOXYzine 50 MG tablet Commonly known as: ATARAX Take 1 tablet (50 mg total) by mouth every  6 (six) hours as needed for anxiety.  Indication: Feeling Anxious   lisinopril 5 MG tablet Commonly known as: ZESTRIL Take 1 tablet (5 mg total) by mouth daily.  Indication: High Blood Pressure Disorder   QUEtiapine 300 MG tablet Commonly known as: SEROQUEL Take 2 tablets (600 mg total) by mouth at bedtime. What changed:  medication strength how much to take  Indication: Depressive Phase of Manic-Depression         Follow-up recommendations: Follow up with [facility] and continue current medication.  Do not relapse into drug or alcohol abuse  Comments: Prescriptions and supply given at discharge  Signed: Mordecai Rasmussen, MD 12/24/2021, 10:18 AM

## 2021-12-24 NOTE — Plan of Care (Signed)
?  Problem: Education: ?Goal: Knowledge of [facility] Education information/materials will improve ?12/24/2021 1057 by Hyman Hopes, RN ?Outcome: Adequate for Discharge ?12/24/2021 0912 by Hyman Hopes, RN ?Outcome: Progressing ?Goal: Emotional status will improve ?12/24/2021 1057 by Hyman Hopes, RN ?Outcome: Adequate for Discharge ?12/24/2021 0912 by Hyman Hopes, RN ?Outcome: Progressing ?Goal: Mental status will improve ?12/24/2021 1057 by Hyman Hopes, RN ?Outcome: Adequate for Discharge ?12/24/2021 0912 by Hyman Hopes, RN ?Outcome: Progressing ?Goal: Verbalization of understanding the information provided will improve ?12/24/2021 1057 by Hyman Hopes, RN ?Outcome: Adequate for Discharge ?12/24/2021 0912 by Hyman Hopes, RN ?Outcome: Progressing ?  ?Problem: Safety: ?Goal: Periods of time without injury will increase ?12/24/2021 1057 by Hyman Hopes, RN ?Outcome: Adequate for Discharge ?12/24/2021 0912 by Hyman Hopes, RN ?Outcome: Progressing ?  ?Problem: Education: ?Goal: Ability to make informed decisions regarding treatment will improve ?12/24/2021 1057 by Hyman Hopes, RN ?Outcome: Adequate for Discharge ?12/24/2021 0912 by Hyman Hopes, RN ?Outcome: Progressing ?  ?Problem: Medication: ?Goal: Compliance with prescribed medication regimen will improve ?12/24/2021 1057 by Hyman Hopes, RN ?Outcome: Adequate for Discharge ?12/24/2021 0912 by Hyman Hopes, RN ?Outcome: Progressing ?  ?

## 2021-12-24 NOTE — Progress Notes (Signed)
?  [facility] Adult Case Management Discharge Plan : ? ?Will you be returning to the same living situation after discharge:  Yes,  pt reports that she is returning home ?At discharge, do you have transportation home?: Yes,  pt reports that her peer is providing transportation ?Do you have the ability to pay for your medications: No. ? ?Release of information consent forms completed and in the chart;  Patient's signature needed at discharge. ? ?Patient to Follow up at: ? Follow-up Information   ? ? [facility].   ?Why: Your appointment is scheduled for 7:15AM on Monday 12/28/2021. ?Contact information: ?[address] ?[place] [address] ?[phone] ? ? ?  ?  ? ?  ?  ? ?  ? ? ?Next level of care provider has access to [facility] ? ?Safety Planning and Suicide Prevention discussed: Yes,  SPE completed with the patient.  ? ?  ? ?Has patient been referred to the Quitline?: Patient refused referral ?.  Patient declined referral for smoking cessation. ? ?Patient has been referred for addiction treatment: Pt. refused referral ? ?Rozann Lesches, LCSW ?12/24/2021, 10:34 AM ?

## 2021-12-24 NOTE — Progress Notes (Signed)
Patient ID: Hannah Reid, female   DOB: August 27, 1981, 41 y.o.   MRN: [id number] ?Patient denies SI/HI/AVH. Belongings were returned to patient. Discharge instructions  including medication and follow up information were reviewed with patient and understanding was verbalized. Patient was not observed to be in distress at time of discharge. Patient was escorted out with staff to medical mall to be transported home by family. ?

## 2021-12-24 NOTE — Progress Notes (Signed)
Recreation Therapy Notes ? ?Date: 12/24/2021 ? ?Time: 10:50 am  ? ?Location: Craft room  ? ?Behavioral response: Appropriate ? ?Intervention Topic:  Relaxation    ? ?Discussion/Intervention:  ?Group content today was focused on relaxation. The group defined relaxation and identified healthy ways to relax. Individuals expressed how much time they spend relaxing. Patients expressed how much their life would be if they did not make time for themselves to relax. The group stated ways they could improve their relaxation techniques in the future.  Individuals participated in the intervention ?Time to Relax? where they had a chance to experience different relaxation techniques.  ?Clinical Observations/Feedback: ?Patient came to group and identified listing to music, bathing, smelling fragrances and artwork as ways she participates in relaxation. Individual was social with peers and staff while participating in the intervention.    ?Abia Monaco LRT/CTRS  ? ? ? ? ? ? ? ? ?Sabel Hornbeck ?12/24/2021 12:48 PM ?

## 2021-12-24 NOTE — Progress Notes (Signed)
?   12/24/21 0930  ?Clinical Encounter Type  ?Visited With Patient  ?Visit Type Initial;Social support  ?Referral From Other (Comment) ?(rounding)  ? ?Chaplain Burris engaged Pt in [place]. Chaplain Burris offered compassionate presence and active listening. Pt shared that she is hopeful about going home today or soon. Pt shared she feels ready. Chaplain B offered social support and encouragement for Pt's process. ?

## 2021-12-24 NOTE — Plan of Care (Signed)
  Problem: Education: Goal: Knowledge of [facility] Education information/materials will improve Outcome: Progressing Goal: Emotional status will improve Outcome: Progressing Goal: Mental status will improve Outcome: Progressing Goal: Verbalization of understanding the information provided will improve Outcome: Progressing   Problem: Safety: Goal: Periods of time without injury will increase Outcome: Progressing   Problem: Education: Goal: Ability to make informed decisions regarding treatment will improve Outcome: Progressing   Problem: Medication: Goal: Compliance with prescribed medication regimen will improve Outcome: Progressing   

## 2022-01-01 ENCOUNTER — Other Ambulatory Visit: Payer: Self-pay | Admitting: Psychiatry

## 2022-01-01 ENCOUNTER — Other Ambulatory Visit: Payer: Self-pay

## 2022-01-01 MED ORDER — FLUOXETINE HCL 10 MG PO CAPS: 10.0000 mg | ORAL_CAPSULE | Freq: Every day | ORAL | 1 refills | Status: DC

## 2022-01-01 MED ORDER — LISINOPRIL 5 MG PO TABS: 5.0000 mg | ORAL_TABLET | Freq: Every day | ORAL | 1 refills | Status: DC

## 2022-01-01 MED ORDER — QUETIAPINE FUMARATE 300 MG PO TABS: 600.0000 mg | ORAL_TABLET | Freq: Every evening | ORAL | 1 refills | Status: DC

## 2022-01-01 MED ORDER — HYDROXYZINE HCL 50 MG PO TABS
50.0000 mg | ORAL_TABLET | Freq: Four times a day (QID) | ORAL | 1 refills | Status: DC | PRN
Start: 2021-12-24 — End: 2022-01-01
  Filled 2022-01-01: qty 60, 15d supply, fill #0

## 2022-01-01 MED ORDER — GABAPENTIN 300 MG PO CAPS: 300.0000 mg | ORAL_CAPSULE | Freq: Three times a day (TID) | ORAL | 1 refills | Status: DC

## 2022-01-01 MED ORDER — FLUOXETINE HCL 10 MG PO CAPS
10.0000 mg | ORAL_CAPSULE | Freq: Every day | ORAL | 1 refills | Status: DC
  Filled 2022-01-01: qty 30, 30d supply, fill #0

## 2022-01-01 MED ORDER — HYDROXYZINE HCL 50 MG PO TABS: 50.0000 mg | ORAL_TABLET | Freq: Four times a day (QID) | ORAL | 1 refills | Status: DC | PRN

## 2022-01-01 MED ORDER — GABAPENTIN 300 MG PO CAPS
300.0000 mg | ORAL_CAPSULE | Freq: Three times a day (TID) | ORAL | 1 refills | Status: DC
  Filled 2022-01-01: qty 90, 30d supply, fill #0

## 2022-01-01 MED ORDER — QUETIAPINE FUMARATE 300 MG PO TABS
600.0000 mg | ORAL_TABLET | Freq: Every evening | ORAL | 1 refills | Status: DC
  Filled 2022-01-01: qty 58, 29d supply, fill #0

## 2022-01-01 MED ORDER — LISINOPRIL 5 MG PO TABS
5.0000 mg | ORAL_TABLET | Freq: Every day | ORAL | 1 refills | Status: DC
Start: 2021-12-24 — End: 2022-01-01
  Filled 2022-01-01: qty 30, 30d supply, fill #0

## 2022-01-01 NOTE — Progress Notes (Signed)
Patient had requested renewal of prescriptions because she misplaced the 1 she was given at discharge.  Orders placed to refill.  I called her but was only able to leave a message. ?

## 2022-01-09 ENCOUNTER — Emergency Department
Admission: EM | Admit: 2022-01-09 | Discharge: 2022-01-09 | Disposition: A | Discharge status: Home / Self Care | Payer: Self-pay | Attending: Emergency Medicine | Admitting: Emergency Medicine

## 2022-01-09 ENCOUNTER — Other Ambulatory Visit: Payer: Self-pay

## 2022-01-09 ENCOUNTER — Emergency Department: Payer: Self-pay

## 2022-01-09 DIAGNOSIS — M79604 Pain in right leg: Secondary | ICD-10-CM

## 2022-01-09 DIAGNOSIS — M79661 Pain in right lower leg: Secondary | ICD-10-CM | POA: Insufficient documentation

## 2022-01-09 MED ORDER — HYDROCODONE-ACETAMINOPHEN 5-325 MG PO TABS: 1.0000 | ORAL_TABLET | Freq: Three times a day (TID) | ORAL | 0 refills | Status: AC | PRN

## 2022-01-09 MED ORDER — NABUMETONE 750 MG PO TABS: 750.0000 mg | ORAL_TABLET | Freq: Two times a day (BID) | ORAL | 0 refills | Status: DC

## 2022-01-09 MED ORDER — CYCLOBENZAPRINE HCL 10 MG PO TABS
10.0000 mg | ORAL_TABLET | Freq: Once | ORAL | Status: AC
  Administered 2022-01-09: 10 mg via ORAL
  Filled 2022-01-09: qty 1

## 2022-01-09 MED ORDER — NAPROXEN 500 MG PO TABS
500.0000 mg | ORAL_TABLET | Freq: Once | ORAL | Status: AC
  Administered 2022-01-09: 500 mg via ORAL
  Filled 2022-01-09: qty 1

## 2022-01-09 MED ORDER — CYCLOBENZAPRINE HCL 5 MG PO TABS: 5.0000 mg | ORAL_TABLET | Freq: Three times a day (TID) | ORAL | 0 refills | Status: DC | PRN

## 2022-01-09 MED ORDER — HYDROCODONE-ACETAMINOPHEN 5-325 MG PO TABS
1.0000 | ORAL_TABLET | Freq: Once | ORAL | Status: AC
  Administered 2022-01-09: 1 via ORAL
  Filled 2022-01-09: qty 1

## 2022-01-09 NOTE — ED Notes (Signed)
Pt presents to the ED with right leg pain. Pt states the pain started this morning when her leg gave out. Pt is able to move extremity but it is painful to move it. Pedal pulse is present in right foot. ? ?Pt also c/o congestion that started a couple of days ago. Pt states she has been having a cough as well.  ?

## 2022-01-09 NOTE — ED Notes (Signed)
Pt verbalized understanding of discharge instructions, prescriptions, and follow-up care instructions. Pt advised if symptoms worsen to return to ED.  

## 2022-01-09 NOTE — ED Provider Notes (Signed)
? ? ?[facility] ?Emergency Department Provider Note ? ? ? ? Event Date/Time  ? First MD Initiated Contact with Patient 01/09/22 1838   ?  (approximate) ? ? ?History  ? ?Leg Pain ? ? ?HPI ? ?Hannah Reid is a 41 y.o. female with history of major depressive disorder, high blood pressure, and suicidal deviation, presents to the ED with complaints of acute onset of right anterior leg pain.  Patient reports waking up this morning without provocation, noted onset of pain from the front of the leg from the knee to the ankle.  She denies any falls, trauma, sprains, or abrasions. ?  ? ? ?Physical Exam  ? ?Triage Vital Signs: ?ED Triage Vitals  ?Enc Vitals Group  ?   BP 01/09/22 1735 (!) 126/99  ?   Pulse Rate 01/09/22 1735 93  ?   Resp 01/09/22 1735 16  ?   Temp 01/09/22 1735 98.8 ?F (37.1 ?C)  ?   Temp Source 01/09/22 1735 Oral  ?   SpO2 01/09/22 1735 96 %  ?   Weight 01/09/22 1736 230 lb (104.3 kg)  ?   Height 01/09/22 1736 5\' 5"  (1.651 m)  ?   Head Circumference --   ?   Peak Flow --   ?   Pain Score 01/09/22 1736 10  ?   Pain Loc --   ?   Pain Edu? --   ?   Excl. in GC? --   ? ? ?Most recent vital signs: ?Vitals:  ? 01/09/22 1735  ?BP: (!) 126/99  ?Pulse: 93  ?Resp: 16  ?Temp: 98.8 ?F (37.1 ?C)  ?SpO2: 96%  ? ? ?General Awake, no distress.  ?CV:  Good peripheral perfusion. RRR ?RESP:  Normal effort. CTA ?ABD:  No distention.  ?MSK:  Right lower extremity without obvious deformity, dislocation, or joint effusions.  Patient with active guarding on attempted passive range of motion.  Knee joint with normal patellar tracking without ballottement.  Patient's was attended light touch over the anterior knee, as well as the anterior lower leg. ? ? ?ED Results / Procedures / Treatments  ? ?Labs ?(all labs ordered are listed, but only abnormal results are displayed) ?Labs Reviewed - No data to display ? ? ?EKG ? ? ?RADIOLOGY ? ?I personally viewed and evaluated these images as part of my medical decision  making, as well as reviewing the written report by the radiologist. ? ?ED Provider Interpretation: no acute DVT} ? ?01/11/22 Venous Img Lower Unilateral Right ? ?Result Date: 01/09/2022 ?CLINICAL DATA:  Calf pain right side. EXAM: RIGHT LOWER EXTREMITY VENOUS DOPPLER ULTRASOUND TECHNIQUE: Gray-scale sonography with compression, as well as color and duplex ultrasound, were performed to evaluate the deep venous system(s) from the level of the common femoral vein through the popliteal and proximal calf veins. COMPARISON:  None. FINDINGS: VENOUS Normal compressibility of the common femoral, superficial femoral, and popliteal veins, as well as the visualized calf veins. Visualized portions of profunda femoral vein and great saphenous vein unremarkable. No filling defects to suggest DVT on grayscale or color Doppler imaging. Doppler waveforms show normal direction of venous flow, normal respiratory plasticity and response to augmentation. Limited views of the contralateral common femoral vein are unremarkable. OTHER None. Limitations: none IMPRESSION: No deep venous thrombosis of the right lower extremity. Electronically Signed   By: 01/11/2022 M.D.   On: 01/09/2022 19:45   ? ? ?PROCEDURES: ? ?Critical Care performed: No ? ?Procedures ? ? ?  MEDICATIONS ORDERED IN ED: ?Medications  ?cyclobenzaprine (FLEXERIL) tablet 10 mg (has no administration in time range)  ?naproxen (NAPROSYN) tablet 500 mg (has no administration in time range)  ?HYDROcodone-acetaminophen (NORCO/VICODIN) 5-325 MG per tablet 1 tablet (has no administration in time range)  ? ? ? ?IMPRESSION / MDM / ASSESSMENT AND PLAN / ED COURSE  ?I reviewed the triage vital signs and the nursing notes. ?             ?               ? ?Differential diagnosis includes, but is not limited to, knee sprain, calf strain, DVT, radiculopathy ? ?Patient to the ED with acute right leg pain on the knee to the foot.  Patient presents with nontraumatic pain, and an exam that is made  difficult by the patient's active resistance.  Evaluation by ultrasound does not reveal an acute DVT.  No acute neuromuscular episode noted on exam.  Patient's diagnosis is consistent with right leg pain. Patient will be discharged home with prescriptions for cyclobenzaprine, Relafen, and a small course of hydrocodone. Patient is to follow up with Ortho as needed or otherwise directed. Patient is given ED precautions to return to the ED for any worsening or new symptoms. ? ?FINAL CLINICAL IMPRESSION(S) / ED DIAGNOSES  ? ?Final diagnoses:  ?Right leg pain  ? ? ? ?Rx / DC Orders  ? ?ED Discharge Orders   ? ?      Ordered  ?  cyclobenzaprine (FLEXERIL) 5 MG tablet  3 times daily PRN       ? 01/09/22 1959  ?  nabumetone (RELAFEN) 750 MG tablet  2 times daily       ? 01/09/22 1959  ?  HYDROcodone-acetaminophen (NORCO) 5-325 MG tablet  3 times daily PRN       ? 01/09/22 1959  ? ?  ?  ? ?  ? ? ? ?Note:  This document was prepared using Dragon voice recognition software and may include unintentional dictation errors. ? ?  ?Deberah Adolf, Charlesetta Ivory, PA-C ?01/09/22 2004 ? ?  ?Phineas Semen, MD ?01/09/22 2011 ? ?

## 2022-01-09 NOTE — Discharge Instructions (Addendum)
Your exam is reassuring overall.  No sign of a DVT to the leg.  Take the muscle relaxant and anti-inflammatory as directed.  Follow-up with your primary provider or orthopedics for ongoing symptoms. ?

## 2022-01-09 NOTE — ED Triage Notes (Signed)
Pt to ED for R leg pain, states that woke up this morning with leg pain to front of leg from knee down. 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 ? ?Pt also has cough and congestion for 1 week. ? ?Pt also feels weak. Pt recently had a fall when same leg "gave out" on her. States that R leg feels "heavy". ? ?Pt endorses with SI questions in triage that had SI about 3 weeks ago and was hospitalized. Pt has no active SI at this time. ?

## 2022-01-21 ENCOUNTER — Other Ambulatory Visit: Payer: Self-pay

## 2022-01-21 ENCOUNTER — Encounter: Payer: Self-pay | Admitting: Adult Health

## 2022-01-21 ENCOUNTER — Ambulatory Visit: Payer: Self-pay | Admitting: Adult Health

## 2022-01-21 VITALS — BP 111/80 | HR 77 | Temp 97.7°F | Ht 63.0 in | Wt 245.7 lb

## 2022-01-21 DIAGNOSIS — I10 Essential (primary) hypertension: Secondary | ICD-10-CM

## 2022-01-21 DIAGNOSIS — Z0000 Encounter for general adult medical examination without abnormal findings: Secondary | ICD-10-CM

## 2022-01-21 DIAGNOSIS — F332 Major depressive disorder, recurrent severe without psychotic features: Secondary | ICD-10-CM

## 2022-01-21 DIAGNOSIS — J069 Acute upper respiratory infection, unspecified: Secondary | ICD-10-CM

## 2022-01-21 DIAGNOSIS — R4689 Other symptoms and signs involving appearance and behavior: Secondary | ICD-10-CM

## 2022-01-21 DIAGNOSIS — F322 Major depressive disorder, single episode, severe without psychotic features: Secondary | ICD-10-CM

## 2022-01-21 MED ORDER — LISINOPRIL 5 MG PO TABS
5.0000 mg | ORAL_TABLET | Freq: Every day | ORAL | 1 refills | Status: DC
  Filled 2022-01-21: qty 90, 90d supply, fill #0
  Filled 2022-01-28 – 2022-02-25 (×2): qty 30, 30d supply, fill #0
  Filled 2022-03-19: qty 30, 30d supply, fill #1
  Filled 2022-04-12: qty 30, 30d supply, fill #2
  Filled 2022-05-12: qty 30, 30d supply, fill #3
  Filled 2022-06-09: qty 30, 30d supply, fill #4

## 2022-01-21 MED ORDER — MELATONIN 10 MG PO TBDP
10.0000 mg | ORAL_TABLET | Freq: Every day | ORAL | 1 refills | Status: AC
  Filled 2022-01-21: qty 30, fill #0

## 2022-01-21 MED ORDER — AMOXICILLIN-POT CLAVULANATE 875-125 MG PO TABS
1.0000 | ORAL_TABLET | Freq: Two times a day (BID) | ORAL | 0 refills | Status: DC
  Filled 2022-01-21: qty 20, 10d supply, fill #0

## 2022-01-21 MED ORDER — HYDROXYZINE HCL 50 MG PO TABS
50.0000 mg | ORAL_TABLET | Freq: Four times a day (QID) | ORAL | 1 refills | Status: DC | PRN
  Filled 2022-01-21 – 2022-01-28 (×2): qty 180, 45d supply, fill #0
  Filled 2022-01-28: qty 29, 8d supply, fill #0
  Filled 2022-02-03: qty 120, 30d supply, fill #1
  Filled 2022-03-19: qty 120, 30d supply, fill #0
  Filled 2022-04-12: qty 120, 30d supply, fill #1
  Filled 2022-05-12: qty 91, 23d supply, fill #2

## 2022-01-21 MED ORDER — QUETIAPINE FUMARATE 300 MG PO TABS
600.0000 mg | ORAL_TABLET | Freq: Every evening | ORAL | 1 refills | Status: DC
  Filled 2022-01-21: qty 180, 90d supply, fill #0
  Filled 2022-01-28 – 2022-03-19 (×2): qty 60, 30d supply, fill #0
  Filled 2022-04-12: qty 60, 30d supply, fill #1
  Filled 2022-05-12: qty 60, 30d supply, fill #2
  Filled 2022-06-09: qty 60, 30d supply, fill #3

## 2022-01-21 MED ORDER — PREDNISONE 10 MG PO TABS
ORAL_TABLET | ORAL | 0 refills | Status: AC
  Filled 2022-01-21: qty 21, 10d supply, fill #0

## 2022-01-21 MED ORDER — FLUOXETINE HCL 10 MG PO CAPS
10.0000 mg | ORAL_CAPSULE | Freq: Every day | ORAL | 1 refills | Status: DC
  Filled 2022-01-21: qty 90, 90d supply, fill #0
  Filled 2022-01-28 – 2022-04-12 (×2): qty 30, 30d supply, fill #0
  Filled 2022-05-12: qty 30, 30d supply, fill #1
  Filled 2022-06-09: qty 30, 30d supply, fill #2

## 2022-01-21 MED ORDER — GABAPENTIN 300 MG PO CAPS
300.0000 mg | ORAL_CAPSULE | Freq: Three times a day (TID) | ORAL | 1 refills | Status: DC
  Filled 2022-01-21: qty 270, 90d supply, fill #0
  Filled 2022-01-28 – 2022-03-19 (×2): qty 90, 30d supply, fill #0
  Filled 2022-04-12: qty 90, 30d supply, fill #1
  Filled 2022-05-12: qty 90, 30d supply, fill #2
  Filled 2022-06-09: qty 90, 30d supply, fill #3

## 2022-01-21 NOTE — Progress Notes (Signed)
Patient ID: Hannah Reid, female   DOB: [birth year], 41 y.o.   MRN: [id number] ? ?Chief Complaint  ?Patient presents with  ? Establish Care  ?  Needs medications. Chest cold, nasal congestion, excessive cold, sore throat for about 2 weeks. Getting worse.  ? Knee Pain  ?  Seen at ED 12/2021 for knee pain. Referred to ortho but cannot afford. Needs [place] application  ? ? ?HPI ?Hannah Reid is a 41 y.o. female who presents for an initial office visit. She has not had a PCP before. She is c/o headache, cough, nasal congestion, sorethroat and chills that started two weeks ago. She reports having a fever yesterday. Cough is non-productive. Associated with orthopnea and PND. She is also c/o left ear pain that started at the same time as the cold symptoms. She reports transient loss of hearing in right ear. She reports decreased appetite, loss of smell but denies loss of taste. She is staying hydrated. She is a current every day smoker and has been since she was 41 years old. She was last admitted to the hospital on March 28th for depression, anxiety, bipolar and PTSD. She has been referred to [facility] and has a zoom consultation today. She was prescribed lisinopril in January for hypertension as her BP was very high in January during one of her mental health hospitalizations. She is also due to be seen by a counselor at [facility]. She denies SI. 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 No other complaints offered ? ? ?History reviewed. No pertinent past medical history. ? ?Past Surgical History:  ?Procedure Laterality Date  ? ABDOMINAL HYSTERECTOMY    ? CHOLECYSTECTOMY    ? kidney tumor removal    ? ? ?Family History  ?Problem Relation Age of Onset  ? Hypertension Mother   ? Cancer Mother   ?     Breast Cancer  ? Hypertension Father   ? ? ?Social History ?Social History  ? ?Tobacco Use  ? Smoking status: Every Day  ?  Packs/day: 0.50  ?  Years: 25.00  ?  Pack years: 12.50  ?  Types: Cigarettes  ? Smokeless tobacco:  Never  ?Vaping Use  ? Vaping Use: Every day  ? Substances: Flavoring  ?Substance Use Topics  ? Alcohol use: Not Currently  ?  Comment: socially  ? Drug use: Not Currently  ?  Comment: 5 years with no use  ? ? ?No Known Allergies ? ?Current Outpatient Medications  ?Medication Sig Dispense Refill  ? FLUoxetine (PROZAC) 10 MG capsule Take 1 capsule (10 mg total) by mouth once daily. 30 capsule 1  ? gabapentin (NEURONTIN) 300 MG capsule Take 1 capsule (300 mg total) by mouth 3 (three) times daily. 90 capsule 1  ? hydrOXYzine (ATARAX) 50 MG tablet Take 1 tablet (50 mg total) by mouth every 6 (six) hours as needed for anxiety. 60 tablet 1  ? lisinopril (ZESTRIL) 5 MG tablet Take 1 tablet (5 mg total) by mouth once daily. 30 tablet 1  ? QUEtiapine (SEROQUEL) 300 MG tablet Take 2 tablets (600 mg total) by mouth once nightly at bedtime. 60 tablet 1  ? cyclobenzaprine (FLEXERIL) 5 MG tablet Take 1 tablet (5 mg total) by mouth 3 (three) times daily as needed. (Patient not taking: Reported on 01/21/2022) 15 tablet 0  ? nabumetone (RELAFEN) 750 MG tablet Take 1 tablet (750 mg total) by mouth 2 (two) times daily for 15 days. (Patient not taking: Reported on 01/21/2022) 30  tablet 0  ? ?No current facility-administered medications for this visit.  ? ? ?Review of Systems ?Review of Systems  ?Constitutional:  Positive for fatigue. Negative for fever.  ?HENT:  Positive for congestion and postnasal drip.   ?Eyes:  Negative for visual disturbance.  ?Cardiovascular:  Negative for chest pain, palpitations and leg swelling.  ?Gastrointestinal:  Negative for abdominal distention, constipation, diarrhea and nausea.  ?Endocrine: Negative for polydipsia, polyphagia and polyuria.  ?Genitourinary: Negative.   ?Musculoskeletal: Negative.   ?Allergic/Immunologic: Negative.   ?Neurological:  Negative for dizziness, weakness, numbness and headaches.  ?Hematological: Negative.   ?Psychiatric/Behavioral:  Positive for dysphoric mood and sleep  disturbance. Negative for decreased concentration and suicidal ideas.   ? ?Blood pressure 111/80, pulse 77, temperature 97.7 ?F (36.5 ?C), temperature source Oral, height _0  (1.6 m), weight 245 lb 11.2 oz (111.4 kg), SpO2 98 %. ? ?Physical Exam ?Physical Exam ?Vitals and nursing note reviewed.  ?Constitutional:   ?   Appearance: Normal appearance. She is obese.  ?HENT:  ?   Head: Normocephalic and atraumatic.  ?   Right Ear: External ear normal.  ?   Left Ear: External ear normal.  ?   Nose: Congestion and rhinorrhea present.  ?   Mouth/Throat:  ?   Mouth: Mucous membranes are moist.  ?Eyes:  ?   Extraocular Movements: Extraocular movements intact.  ?   Conjunctiva/sclera: Conjunctivae normal.  ?   Pupils: Pupils are equal, round, and reactive to light.  ?Cardiovascular:  ?   Rate and Rhythm: Normal rate and regular rhythm.  ?   Pulses: Normal pulses.  ?   Heart sounds: Normal heart sounds.  ?Pulmonary:  ?   Effort: Pulmonary effort is normal.  ?   Breath sounds: Normal breath sounds.  ?Abdominal:  ?   General: Bowel sounds are normal. There is distension (central obesity).  ?   Palpations: Abdomen is soft.  ?Genitourinary: ?   Comments: Deferred per patient's request ?Musculoskeletal:     ?   General: No deformity or signs of injury. Normal range of motion.  ?   Cervical back: Normal range of motion and neck supple.  ?Skin: ?   General: Skin is warm and dry.  ?Neurological:  ?   General: No focal deficit present.  ?   Mental Status: She is alert and oriented to person, place, and time.  ?Psychiatric:     ?   Behavior: Behavior normal.  ?   Comments: Flat affect, mood is depressed  ? ? ?Data Reviewed ?Labs ordered ? ?Assessment and Plan ?1. URI, acute ?Patient is a current smoker. Denies any allergies. Will treat empirically with prednisone taper and Augmentin. Patient advised to RTC if symptoms fail to resolve or worsen ? ?2. Hypertension, unspecified type ?BP is normal today. Continue lisinopril 26m daily. Low  salt diet recommended ? ?3. Major depressive disorder, single episode, severe without psychotic features [place] ?Mood is depressed. [facility] ? ?4. Aggressive behavior ?Currently seeing a counselor at [facility]  ? ?5. Health maintenance examination ?Besides URI and depressive symptoms, patient has an otherwise normal exam. Will screen for risk factors and also obtain routine labs ?- CBC w/Diff; Future ?- Comp Met (CMET); Future ?- TSH; Future ?- Lipid Profile; Future ?- HgB A1c; Future ?- Magnesium; Future ?- Phosphorus; Future  ? ? ? ?Hannah Reid ?01/21/2022, 11:39 AM ? ? ? ?

## 2022-01-25 ENCOUNTER — Other Ambulatory Visit: Payer: Self-pay

## 2022-01-25 ENCOUNTER — Emergency Department: Payer: Self-pay

## 2022-01-25 ENCOUNTER — Emergency Department
Admission: EM | Admit: 2022-01-25 | Discharge: 2022-01-25 | Disposition: A | Discharge status: Home / Self Care | Payer: Self-pay | Attending: Emergency Medicine | Admitting: Emergency Medicine

## 2022-01-25 DIAGNOSIS — J069 Acute upper respiratory infection, unspecified: Secondary | ICD-10-CM | POA: Insufficient documentation

## 2022-01-25 DIAGNOSIS — M545 Low back pain, unspecified: Secondary | ICD-10-CM | POA: Insufficient documentation

## 2022-01-25 DIAGNOSIS — Z20822 Contact with and (suspected) exposure to covid-19: Secondary | ICD-10-CM | POA: Insufficient documentation

## 2022-01-25 HISTORY — DX: Essential (primary) hypertension: I10

## 2022-01-25 LAB — URINALYSIS, ROUTINE W REFLEX MICROSCOPIC
Bilirubin Urine: NEGATIVE
Glucose, UA: NEGATIVE mg/dL
Hgb urine dipstick: NEGATIVE
Ketones, ur: NEGATIVE mg/dL
Leukocytes,Ua: NEGATIVE
Nitrite: NEGATIVE
Protein, ur: NEGATIVE mg/dL
Specific Gravity, Urine: 1.028 (ref 1.005–1.030)
pH: 7 (ref 5.0–8.0)

## 2022-01-25 LAB — RESP PANEL BY RT-PCR (FLU A&B, COVID) ARPGX2
Influenza A by PCR: NEGATIVE
Influenza B by PCR: NEGATIVE
SARS Coronavirus 2 by RT PCR: NEGATIVE

## 2022-01-25 MED ORDER — AZITHROMYCIN 250 MG PO TABS
ORAL_TABLET | ORAL | 0 refills | Status: AC
  Filled 2022-01-25: qty 6, 5d supply, fill #0

## 2022-01-25 MED ORDER — AZITHROMYCIN 250 MG PO TABS: ORAL_TABLET | ORAL | 0 refills | Status: DC

## 2022-01-25 NOTE — ED Provider Notes (Signed)
? ?[facility] ?Provider Note ? ?Patient Contact: 5:26 PM (approximate) ? ? ?History  ? ?URI ? ? ?HPI ? ?Hannah Reid is a 41 y.o. female presents to the emergency department with concern for generalized malaise, low back pain and vomiting that occurred at home.  Patient states that she had a viral URI-like illness approximately week ago, was seen at the walk-in clinic was started on amoxicillin.  She states she developed a rash and stopped medication but states that her symptoms have persisted.  She denies chest pain, chest tightness or abdominal pain but states that she has chills and fatigue.  She denies possibility of pregnancy. ? ?  ? ? ?Physical Exam  ? ?Triage Vital Signs: ?ED Triage Vitals  ?Enc Vitals Group  ?   BP 01/25/22 1524 140/89  ?   Pulse Rate 01/25/22 1524 84  ?   Resp 01/25/22 1524 17  ?   Temp 01/25/22 1524 98 ?F (36.7 ?C)  ?   Temp Source 01/25/22 1524 Oral  ?   SpO2 01/25/22 1525 98 %  ?   Weight 01/25/22 1525 245 lb (111.1 kg)  ?   Height 01/25/22 1525 5\' 4"  (1.626 m)  ?   Head Circumference --   ?   Peak Flow --   ?   Pain Score 01/25/22 1524 5  ?   Pain Loc --   ?   Pain Edu? --   ?   Excl. in GC? --   ? ? ?Most recent vital signs: ?Vitals:  ? 01/25/22 1524 01/25/22 1525  ?BP: 140/89   ?Pulse: 84   ?Resp: 17   ?Temp: 98 ?F (36.7 ?C)   ?SpO2:  98%  ? ? ? ?General: Alert and in no acute distress. ?Eyes:  PERRL. EOMI. ?Head: No acute traumatic findings ?ENT: ?     Nose: No congestion/rhinnorhea. ?     Mouth/Throat: Mucous membranes are moist.  ?Neck: No stridor. No cervical spine tenderness to palpation. ?Cardiovascular:  Good peripheral perfusion ?Respiratory: Normal respiratory effort without tachypnea or retractions. Lungs CTAB. Good air entry to the bases with no decreased or absent breath sounds. ?Gastrointestinal: Bowel sounds ?4 quadrants. Soft and nontender to palpation. No guarding or rigidity. No palpable masses. No distention. No CVA  tenderness. ?Musculoskeletal: Full range of motion to all extremities.  ?Neurologic:  No gross focal neurologic deficits are appreciated.  ?Skin:   No rash noted ?Other: ? ? ?ED Results / Procedures / Treatments  ? ?Labs ?(all labs ordered are listed, but only abnormal results are displayed) ?Labs Reviewed  ?URINALYSIS, ROUTINE W REFLEX MICROSCOPIC - Abnormal; Notable for the following components:  ?    Result Value  ? Color, Urine YELLOW (*)   ? APPearance HAZY (*)   ? All other components within normal limits  ?RESP PANEL BY RT-PCR (FLU A&B, COVID) [id number]  ? ? ? ? ?PROCEDURES: ? ?Critical Care performed: No ? ?Procedures ? ? ?MEDICATIONS ORDERED IN ED: ?Medications - No data to display ? ? ?IMPRESSION / MDM / ASSESSMENT AND PLAN / ED COURSE  ?I reviewed the triage vital signs and the nursing notes. ?             ?               ? ?Assessment and plan:  ?Bronchitis ?41 year old female presents to the emergency department with cough, nasal congestion and rhinorrhea for approximately 1 week.  She is also had  some low back pain and vomiting. ? ?Vital signs are reassuring at triage.  On physical exam, patient was alert, active and nontoxic-appearing.  Patient was originally treated with amoxicillin and prednisone but discontinued medication after she developed a rash. ? ?We will transition patient to azithromycin and advised patient to continue taking prednisone.  Return precautions were given to return with new or worsening symptoms. ? ? ?FINAL CLINICAL IMPRESSION(S) / ED DIAGNOSES  ? ?Final diagnoses:  ?Viral URI with cough  ? ? ? ?Rx / DC Orders  ? ?ED Discharge Orders   ? ?      Ordered  ?  azithromycin (ZITHROMAX Z-PAK) 250 MG tablet  Status:  Discontinued       ? 01/25/22 1815  ?  azithromycin (ZITHROMAX Z-PAK) 250 MG tablet       ? 01/25/22 1822  ? ?  ?  ? ?  ? ? ? ?Note:  This document was prepared using Dragon voice recognition software and may include unintentional dictation errors. ?  ?Orvil Feil,  PA-C ?01/25/22 2142 ? ?  ?Gilles Chiquito, MD ?01/25/22 2302 ? ?

## 2022-01-25 NOTE — ED Triage Notes (Signed)
Pt c/o cough congestion body aches for the past couple of weeks and [facility], states she stopped taking them because she broke out into a rash on her face and now is not feeling any better since having to stop the medication  ?

## 2022-01-25 NOTE — Discharge Instructions (Signed)
Take Z Pack as directed.  ?

## 2022-01-26 ENCOUNTER — Other Ambulatory Visit: Payer: Self-pay

## 2022-01-27 ENCOUNTER — Other Ambulatory Visit: Payer: Self-pay

## 2022-01-28 ENCOUNTER — Ambulatory Visit: Payer: Self-pay | Admitting: Adult Health

## 2022-01-28 ENCOUNTER — Other Ambulatory Visit: Payer: Self-pay

## 2022-01-28 ENCOUNTER — Encounter: Payer: Self-pay | Admitting: Adult Health

## 2022-02-03 ENCOUNTER — Other Ambulatory Visit: Payer: Self-pay

## 2022-02-04 ENCOUNTER — Other Ambulatory Visit: Payer: Self-pay

## 2022-02-04 ENCOUNTER — Telehealth: Payer: Self-pay | Admitting: Pharmacy Technician

## 2022-02-04 NOTE — Telephone Encounter (Signed)
Patient only signed DOH Attestation.  Would need to provide current year's poi if PAP medications were needed. ? ?Culver Feighner J. Lesslie Mckeehan ?Patient Advocate Specialist ?[facility]  ?

## 2022-02-18 ENCOUNTER — Other Ambulatory Visit: Payer: Self-pay

## 2022-02-19 ENCOUNTER — Other Ambulatory Visit: Payer: Self-pay

## 2022-02-19 MED ORDER — QUETIAPINE FUMARATE 300 MG PO TABS
ORAL_TABLET | ORAL | 1 refills | Status: DC
  Filled 2022-02-19: qty 60, 30d supply, fill #0

## 2022-02-19 MED ORDER — HYDROXYZINE HCL 50 MG PO TABS
ORAL_TABLET | ORAL | 1 refills | Status: DC
  Filled 2022-02-19: qty 120, 30d supply, fill #0

## 2022-02-19 MED ORDER — GABAPENTIN 300 MG PO CAPS
ORAL_CAPSULE | ORAL | 1 refills | Status: DC
  Filled 2022-02-19: qty 90, 30d supply, fill #0

## 2022-02-25 ENCOUNTER — Other Ambulatory Visit: Payer: Self-pay

## 2022-03-11 ENCOUNTER — Emergency Department: Payer: Self-pay

## 2022-03-11 ENCOUNTER — Emergency Department
Admission: EM | Admit: 2022-03-11 | Discharge: 2022-03-11 | Disposition: A | Discharge status: Home / Self Care | Payer: Self-pay | Attending: Student in an Organized Health Care Education/Training Program | Admitting: Student in an Organized Health Care Education/Training Program

## 2022-03-11 DIAGNOSIS — F1490 Cocaine use, unspecified, uncomplicated: Secondary | ICD-10-CM

## 2022-03-11 DIAGNOSIS — R079 Chest pain, unspecified: Secondary | ICD-10-CM | POA: Insufficient documentation

## 2022-03-11 DIAGNOSIS — R4182 Altered mental status, unspecified: Secondary | ICD-10-CM | POA: Insufficient documentation

## 2022-03-11 DIAGNOSIS — F1499 Cocaine use, unspecified with unspecified cocaine-induced disorder: Secondary | ICD-10-CM | POA: Insufficient documentation

## 2022-03-11 LAB — CBC WITH DIFFERENTIAL/PLATELET
Abs Immature Granulocytes: 0.03 10*3/uL (ref 0.00–0.07)
Basophils Absolute: 0.1 10*3/uL (ref 0.0–0.1)
Basophils Relative: 1 %
Eosinophils Absolute: 0.3 10*3/uL (ref 0.0–0.5)
Eosinophils Relative: 3 %
HCT: 40 % (ref 36.0–46.0)
Hemoglobin: 13.5 g/dL (ref 12.0–15.0)
Immature Granulocytes: 0 %
Lymphocytes Relative: 25 %
Lymphs Abs: 2.2 10*3/uL (ref 0.7–4.0)
MCH: 29.4 pg (ref 26.0–34.0)
MCHC: 33.8 g/dL (ref 30.0–36.0)
MCV: 87.1 fL (ref 80.0–100.0)
Monocytes Absolute: 0.6 10*3/uL (ref 0.1–1.0)
Monocytes Relative: 7 %
Neutro Abs: 5.6 10*3/uL (ref 1.7–7.7)
Neutrophils Relative %: 64 %
Platelets: 333 10*3/uL (ref 150–400)
RBC: 4.59 MIL/uL (ref 3.87–5.11)
RDW: 11.9 % (ref 11.5–15.5)
WBC: 8.7 10*3/uL (ref 4.0–10.5)
nRBC: 0 % (ref 0.0–0.2)

## 2022-03-11 LAB — URINE DRUG SCREEN, QUALITATIVE (ARMC ONLY)
Amphetamines, Ur Screen: NOT DETECTED
Barbiturates, Ur Screen: NOT DETECTED
Benzodiazepine, Ur Scrn: NOT DETECTED
Cannabinoid 50 Ng, Ur ~~LOC~~: POSITIVE — AB
Cocaine Metabolite,Ur ~~LOC~~: POSITIVE — AB
MDMA (Ecstasy)Ur Screen: NOT DETECTED
Methadone Scn, Ur: NOT DETECTED
Opiate, Ur Screen: NOT DETECTED
Phencyclidine (PCP) Ur S: NOT DETECTED
Tricyclic, Ur Screen: POSITIVE — AB

## 2022-03-11 LAB — COMPREHENSIVE METABOLIC PANEL
ALT: 29 U/L (ref 0–44)
AST: 23 U/L (ref 15–41)
Albumin: 4.4 g/dL (ref 3.5–5.0)
Alkaline Phosphatase: 82 U/L (ref 38–126)
Anion gap: 8 (ref 5–15)
BUN: 15 mg/dL (ref 6–20)
CO2: 26 mmol/L (ref 22–32)
Calcium: 9.2 mg/dL (ref 8.9–10.3)
Chloride: 105 mmol/L (ref 98–111)
Creatinine, Ser: 1.09 mg/dL — ABNORMAL HIGH (ref 0.44–1.00)
GFR, Estimated: >60 mL/min (ref >60)
Glucose, Bld: 123 mg/dL — ABNORMAL HIGH (ref 70–99)
Potassium: 3.7 mmol/L (ref 3.5–5.1)
Sodium: 139 mmol/L (ref 135–145)
Total Bilirubin: 0.6 mg/dL (ref 0.3–1.2)
Total Protein: 7.6 g/dL (ref 6.5–8.1)

## 2022-03-11 LAB — BLOOD GAS, VENOUS
Acid-Base Excess: 3 mmol/L — ABNORMAL HIGH (ref 0.0–2.0)
Bicarbonate: 28.5 mmol/L — ABNORMAL HIGH (ref 20.0–28.0)
O2 Saturation: 72.9 %
Patient temperature: 37
pCO2, Ven: 46 mmHg (ref 44–60)
pH, Ven: 7.4 (ref 7.25–7.43)
pO2, Ven: 40 mmHg (ref 32–45)

## 2022-03-11 LAB — TROPONIN I (HIGH SENSITIVITY)
Troponin I (High Sensitivity): 3 ng/L (ref <18)
Troponin I (High Sensitivity): 3 ng/L (ref <18)

## 2022-03-11 LAB — SALICYLATE LEVEL: Salicylate Lvl: <7 mg/dL — ABNORMAL LOW (ref 7.0–30.0)

## 2022-03-11 LAB — ACETAMINOPHEN LEVEL: Acetaminophen (Tylenol), Serum: <10 ug/mL — ABNORMAL LOW (ref 10–30)

## 2022-03-11 MED ORDER — LORAZEPAM 2 MG/ML IJ SOLN
0.5000 mg | Freq: Once | INTRAMUSCULAR | Status: AC
  Administered 2022-03-11: 0.5 mg via INTRAVENOUS
  Filled 2022-03-11: qty 1

## 2022-03-11 NOTE — ED Provider Notes (Signed)
[facility] Provider Note    Event Date/Time   First MD Initiated Contact with Patient 03/11/22 1812     (approximate)   History   Altered Mental Status (Coming from home, AMS, unknown what drugs pt has taken, pt became hypotensive with EMS. C/o chest pain)  Level V Caveat:  AMS  HPI  Hannah Reid is a 41 y.o. female history of psychiatric illness presents to the ER emergency traffic due to unresponsiveness.  Reportedly having slow respirations unknown how long she has been like this per EMS.  She was given 2 mg Narcan without any response.  No hypoxia no tachycardia did have brief episode of hypotension in route.  No report of trauma.  No suspected overdose per bystanders.     Physical Exam   Triage Vital Signs: ED Triage Vitals  Enc Vitals Group     BP      Pulse      Resp      Temp      Temp src      SpO2      Weight      Height      Head Circumference      Peak Flow      Pain Score      Pain Loc      Pain Edu?      Excl. in GC?     Most recent vital signs: Vitals:   03/11/22 1930 03/11/22 2150  BP: 134/84 125/82  Pulse: 81 75  Resp: 17 18  Temp:    SpO2: 100% 97%     Constitutional: Patient lying with eyes closed with slow respirations fighting to keep eyes closed with manually trying to examine pupils.  Does withdraw and localize to pain.  Gag present, Protecting airway Eyes: Conjunctivae are normal.  Head: Atraumatic. Nose: No congestion/rhinnorhea. Mouth/Throat: Mucous membranes are moist.   Neck: Painless ROM.  Cardiovascular:   Good peripheral circulation. Respiratory: Normal respiratory effort.  No retractions.  Gastrointestinal: Soft and nontender.  Musculoskeletal:  no deformity Neurologic:  MAE spontaneously. No gross focal neurologic deficits are appreciated.  Skin:  Skin is warm, dry and intact. No rash noted. Psychiatric: Mood and affect are normal. Speech and behavior are normal.    ED Results /  Procedures / Treatments   Labs (all labs ordered are listed, but only abnormal results are displayed) Labs Reviewed  BLOOD GAS, VENOUS - Abnormal; Notable for the following components:      Result Value   Bicarbonate 28.5 (*)    Acid-Base Excess 3.0 (*)    All other components within normal limits  COMPREHENSIVE METABOLIC PANEL - Abnormal; Notable for the following components:   Glucose, Bld 123 (*)    Creatinine, Ser 1.09 (*)    All other components within normal limits  ACETAMINOPHEN LEVEL - Abnormal; Notable for the following components:   Acetaminophen (Tylenol), Serum <10 (*)    All other components within normal limits  SALICYLATE LEVEL - Abnormal; Notable for the following components:   Salicylate Lvl <7.0 (*)    All other components within normal limits  URINE DRUG SCREEN, QUALITATIVE ([facility] ONLY) - Abnormal; Notable for the following components:   Tricyclic, Ur Screen POSITIVE (*)    Cocaine Metabolite,Ur [place] POSITIVE (*)    Cannabinoid 50 Ng, Ur [place] POSITIVE (*)    All other components within normal limits  CBC WITH DIFFERENTIAL/PLATELET  CBG MONITORING, ED  POC URINE PREG, ED  TROPONIN I (HIGH SENSITIVITY)  TROPONIN I (HIGH SENSITIVITY)     EKG  ED ECG REPORT I, Willy Eddy, the attending physician, personally viewed and interpreted this ECG.   Date: 03/11/2022  EKG Time: 18:12  Rate: 95  Rhythm: sinus  Axis: normal  Intervals:normal  ST&T Change: no stemi,no depressions, normal intervals    RADIOLOGY Please see ED Course for my review and interpretation.  I personally reviewed all radiographic images ordered to evaluate for the above acute complaints and reviewed radiology reports and findings.  These findings were personally discussed with the patient.  Please see medical record for radiology report.    PROCEDURES:  Critical Care performed: no  Procedures   MEDICATIONS ORDERED IN ED: Medications  LORazepam (ATIVAN) injection 0.5 mg (0.5  mg Intravenous Given 03/11/22 1835)     IMPRESSION / MDM / ASSESSMENT AND PLAN / ED COURSE  I reviewed the triage vital signs and the nursing notes.                              Differential diagnosis includes, but is not limited to, anxiety, depression, conversion, SDH, IPH, CVA, ACS, dissection, PE, reflux,  Patient presented to the ER altered mental status unresponsive per dyspneic as described above.  Respiratory therapy emergency personnel immediately available upon arrival given concern for unresponsiveness possibly requiring intubation.  This presenting complaint could reflect a potentially life-threatening illness therefore the patient will be placed on continuous pulse oximetry and telemetry for monitoring.  Laboratory evaluation will be sent to evaluate for the above complaints.   No response to Narcan.  Patient does localize to pain and when checking gag reflex will move away and swipe Q-tip away.   Clinical Course as of 03/11/22 2204  Thu Mar 11, 2022  2992 On further review the patient has had multiple evaluations for psychiatric illness.  She appears to be voluntarily holding her breath.  She is now stating that she is having chest pain feels anxious.  She is PERC negative.  EKG nonischemic.  Initial troponin negative.  Will give IV Ativan.  She denies any overdose or therapeutic misadventure. [PR]  1840 Chest x-ray my interpretation does not show any evidence of cardiomegaly. [PR]  1930 VBG is completely normal.  She remains not hypoxic on monitor.  This is more consistent with psychiatric illness.  Will consult psychiatry. [PR]  2002 Patient now ambulating in no acute distress. [PR]  2047 UDS is positive for cocaine tricyclics as well as cannabinol use. [PR]  2107 Patient now requesting discharge home. [PR]  2202 Patient reassessed.  She is pain-free.  Does admit to using cocaine prior to the event today.  Do think this is a therapeutic misadventure.  Denies any other coingestion  or intent for self-harm.  At this point do believe she stable and appropriate for discharge home. [PR]    Clinical Course User Index [PR] Willy Eddy, MD     FINAL CLINICAL IMPRESSION(S) / ED DIAGNOSES   Final diagnoses:  Altered mental status, unspecified altered mental status type  Chest pain, unspecified type  Cocaine use     Rx / DC Orders   ED Discharge Orders     None        Note:  This document was prepared using Dragon voice recognition software and may include unintentional dictation errors.    Willy Eddy, MD 03/11/22 2204

## 2022-03-11 NOTE — ED Notes (Signed)
Pt A&O, IV's removed, pt given discharge instructions, pt ambulating with steady gait.

## 2022-03-19 ENCOUNTER — Other Ambulatory Visit: Payer: Self-pay

## 2022-04-12 ENCOUNTER — Other Ambulatory Visit: Payer: Self-pay

## 2022-05-12 ENCOUNTER — Other Ambulatory Visit: Payer: Self-pay

## 2022-06-02 ENCOUNTER — Other Ambulatory Visit: Payer: Self-pay

## 2022-06-09 ENCOUNTER — Other Ambulatory Visit: Payer: Self-pay

## 2022-06-29 ENCOUNTER — Ambulatory Visit: Payer: Self-pay | Admitting: Gerontology

## 2022-07-03 ENCOUNTER — Emergency Department
Admission: EM | Admit: 2022-07-03 | Discharge: 2022-07-06 | Disposition: A | Discharge status: Other Acute Inpatient Hospital | Payer: 59 | Attending: Emergency Medicine | Admitting: Emergency Medicine

## 2022-07-03 ENCOUNTER — Encounter: Payer: Self-pay | Admitting: Emergency Medicine

## 2022-07-03 ENCOUNTER — Other Ambulatory Visit: Payer: Self-pay

## 2022-07-03 DIAGNOSIS — W260XXA Contact with knife, initial encounter: Secondary | ICD-10-CM | POA: Insufficient documentation

## 2022-07-03 DIAGNOSIS — R440 Auditory hallucinations: Secondary | ICD-10-CM | POA: Insufficient documentation

## 2022-07-03 DIAGNOSIS — Z1152 Encounter for screening for COVID-19: Secondary | ICD-10-CM | POA: Insufficient documentation

## 2022-07-03 DIAGNOSIS — Z046 Encounter for general psychiatric examination, requested by authority: Secondary | ICD-10-CM | POA: Insufficient documentation

## 2022-07-03 DIAGNOSIS — F322 Major depressive disorder, single episode, severe without psychotic features: Secondary | ICD-10-CM | POA: Diagnosis present

## 2022-07-03 DIAGNOSIS — R45851 Suicidal ideations: Secondary | ICD-10-CM

## 2022-07-03 DIAGNOSIS — R456 Violent behavior: Secondary | ICD-10-CM | POA: Insufficient documentation

## 2022-07-03 DIAGNOSIS — R4689 Other symptoms and signs involving appearance and behavior: Secondary | ICD-10-CM | POA: Diagnosis present

## 2022-07-03 DIAGNOSIS — Z1339 Encounter for screening examination for other mental health and behavioral disorders: Secondary | ICD-10-CM | POA: Insufficient documentation

## 2022-07-03 DIAGNOSIS — S61412A Laceration without foreign body of left hand, initial encounter: Secondary | ICD-10-CM | POA: Insufficient documentation

## 2022-07-03 DIAGNOSIS — I10 Essential (primary) hypertension: Secondary | ICD-10-CM | POA: Insufficient documentation

## 2022-07-03 LAB — URINE DRUG SCREEN, QUALITATIVE (ARMC ONLY)
Amphetamines, Ur Screen: NOT DETECTED
Barbiturates, Ur Screen: NOT DETECTED
Benzodiazepine, Ur Scrn: POSITIVE — AB
Cannabinoid 50 Ng, Ur ~~LOC~~: POSITIVE — AB
Cocaine Metabolite,Ur ~~LOC~~: NOT DETECTED
MDMA (Ecstasy)Ur Screen: NOT DETECTED
Methadone Scn, Ur: NOT DETECTED
Opiate, Ur Screen: POSITIVE — AB
Phencyclidine (PCP) Ur S: NOT DETECTED
Tricyclic, Ur Screen: POSITIVE — AB

## 2022-07-03 LAB — CBC WITH DIFFERENTIAL/PLATELET
Abs Immature Granulocytes: 0.03 10*3/uL (ref 0.00–0.07)
Basophils Absolute: 0.1 10*3/uL (ref 0.0–0.1)
Basophils Relative: 1 %
Eosinophils Absolute: 0.2 10*3/uL (ref 0.0–0.5)
Eosinophils Relative: 2 %
HCT: 42.7 % (ref 36.0–46.0)
Hemoglobin: 14.3 g/dL (ref 12.0–15.0)
Immature Granulocytes: 0 %
Lymphocytes Relative: 35 %
Lymphs Abs: 2.5 10*3/uL (ref 0.7–4.0)
MCH: 27.6 pg (ref 26.0–34.0)
MCHC: 33.5 g/dL (ref 30.0–36.0)
MCV: 82.3 fL (ref 80.0–100.0)
Monocytes Absolute: 0.4 10*3/uL (ref 0.1–1.0)
Monocytes Relative: 6 %
Neutro Abs: 3.8 10*3/uL (ref 1.7–7.7)
Neutrophils Relative %: 56 %
Platelets: 337 10*3/uL (ref 150–400)
RBC: 5.19 MIL/uL — ABNORMAL HIGH (ref 3.87–5.11)
RDW: 12.4 % (ref 11.5–15.5)
WBC: 7 10*3/uL (ref 4.0–10.5)
nRBC: 0 % (ref 0.0–0.2)

## 2022-07-03 LAB — SALICYLATE LEVEL: Salicylate Lvl: <7 mg/dL — ABNORMAL LOW (ref 7.0–30.0)

## 2022-07-03 LAB — COMPREHENSIVE METABOLIC PANEL
ALT: 81 U/L — ABNORMAL HIGH (ref 0–44)
AST: 39 U/L (ref 15–41)
Albumin: 4.2 g/dL (ref 3.5–5.0)
Alkaline Phosphatase: 84 U/L (ref 38–126)
Anion gap: 7 (ref 5–15)
BUN: 18 mg/dL (ref 6–20)
CO2: 28 mmol/L (ref 22–32)
Calcium: 9.4 mg/dL (ref 8.9–10.3)
Chloride: 107 mmol/L (ref 98–111)
Creatinine, Ser: 1.06 mg/dL — ABNORMAL HIGH (ref 0.44–1.00)
GFR, Estimated: >60 mL/min (ref >60)
Glucose, Bld: 77 mg/dL (ref 70–99)
Potassium: 4.3 mmol/L (ref 3.5–5.1)
Sodium: 142 mmol/L (ref 135–145)
Total Bilirubin: 0.6 mg/dL (ref 0.3–1.2)
Total Protein: 7.4 g/dL (ref 6.5–8.1)

## 2022-07-03 LAB — POC URINE PREG, ED: Preg Test, Ur: NEGATIVE

## 2022-07-03 LAB — ETHANOL: Alcohol, Ethyl (B): <10 mg/dL (ref <10)

## 2022-07-03 LAB — ACETAMINOPHEN LEVEL: Acetaminophen (Tylenol), Serum: <10 ug/mL — ABNORMAL LOW (ref 10–30)

## 2022-07-03 NOTE — ED Notes (Signed)
pt recieved snack and drink 

## 2022-07-03 NOTE — ED Notes (Signed)
TTS speaking with patient. 

## 2022-07-03 NOTE — ED Notes (Signed)
Pt reports left ear pain

## 2022-07-03 NOTE — ED Notes (Signed)
Patient reports constant suicidal thoughts with no actual plan.  Patient reports admission at beginning of the year.  Reports does not feel that medications are working.  Patient reports that she has not followed up due to not being able to keep appointments.  Patient with wound to pinky side of hand from razor that was self infected several days ago, no bleeding or exudate noted.

## 2022-07-03 NOTE — ED Provider Notes (Signed)
[facility] Provider Note    Event Date/Time   First MD Initiated Contact with Patient 07/03/22 2017     (approximate)   History   Chief Complaint: Psychiatric Evaluation   HPI  Hannah Reid is a 41 y.o. female with a history of depression, hypertension, suicidality who comes ED complaining of feeling more depressed, suicidal thoughts, not eating, not sleeping.  Also endorses some auditory hallucinations.  2 days ago she had a knife and she reports that she intended to kill herself but her wife intervened and prevented her from doing so.  In the process, the patient did manage to cut the hypothenar lateral edge of the left hand.  No ongoing bleeding.    Tdap last given October 15, 2021.  Denies any other pain.  Denies ingestions.  Physical Exam   Triage Vital Signs: ED Triage Vitals  Enc Vitals Group     BP 07/03/22 1939 (!) 123/96     Pulse Rate 07/03/22 1939 95     Resp 07/03/22 1939 18     Temp 07/03/22 1939 98.4 F (36.9 C)     Temp Source 07/03/22 1939 Oral     SpO2 07/03/22 1939 96 %     Weight 07/03/22 2005 244 lb 14.9 oz (111.1 kg)     Height 07/03/22 2005 5\' 4"  (1.626 m)     Head Circumference --      Peak Flow --      Pain Score 07/03/22 2005 10     Pain Loc --      Pain Edu? --      Excl. in [place]? --     Most recent vital signs: Vitals:   07/03/22 1939  BP: (!) 123/96  Pulse: 95  Resp: 18  Temp: 98.4 F (36.9 C)  SpO2: 96%    General: Awake, no distress.  CV:  Good peripheral perfusion.  Normal peripheral pulses. Resp:  Normal effort.  Abd:  No distention.  Other:  Left hand with a 1.5 cm linear laceration through the dermis.  No signs of infection, no drainage.  No foreign bodies.   ED Results / Procedures / Treatments   Labs (all labs ordered are listed, but only abnormal results are displayed) Labs Reviewed  CBC WITH DIFFERENTIAL/PLATELET - Abnormal; Notable for the following components:      Result Value    RBC 5.19 (*)    All other components within normal limits  ACETAMINOPHEN LEVEL  COMPREHENSIVE METABOLIC PANEL  ETHANOL  SALICYLATE LEVEL  URINE DRUG SCREEN, QUALITATIVE ([place])  POC URINE PREG, ED     EKG    RADIOLOGY    PROCEDURES:  Procedures   MEDICATIONS ORDERED IN ED: Medications - No data to display   IMPRESSION / MDM / [place] / ED COURSE  I reviewed the triage vital signs and the nursing notes.                               Patient presents with symptoms of depression, high risk with previous self-injurious behaviors and impulsive behavior.  She declines to have her hand laceration repaired today.  Will initiate IVC and consult psychiatry.  She is medically stable.  The patient has been placed in psychiatric observation due to the need to provide a safe environment for the patient while obtaining psychiatric consultation and evaluation, as well as ongoing medical and medication management  to treat the patient's condition.  The patient has been placed under full IVC at this time.        FINAL CLINICAL IMPRESSION(S) / ED DIAGNOSES   Final diagnoses:  Suicidal ideation  Laceration of left hand without foreign body, initial encounter     Rx / DC Orders   ED Discharge Orders     None        Note:  This document was prepared using Dragon voice recognition software and may include unintentional dictation errors.   Carrie Mew, MD 07/03/22 2116

## 2022-07-03 NOTE — ED Notes (Signed)
IVC/pending psych consult 

## 2022-07-03 NOTE — ED Triage Notes (Signed)
Pt reports she is very depressed, reports she is SI, denies any plan at present. Pt reports she has not eaten today. Pt denies any HI. Pt denies any visual hallucinations. Pt reports she hears some voices reports voices tell her that she is a failure. Pt reports 2 days ago she cut her left hand and has not been evaluated for.  Reports "I feel like I am two different people. One minute I'm happy the other minute I don't give a fuck and I want to died"

## 2022-07-03 NOTE — ED Notes (Signed)
Pt stated she can not remove right eyebrow piercing

## 2022-07-03 NOTE — ED Notes (Signed)
Pt dressed out in burgundy scrubs with myself and [place] NT present.  Pts belongings, include hoodie, shirt, bra, pants, socks, shoes, nose ring, 2 white colored rings.  Pt has a personal black bag with miscellaneous personal items.  Cell phone, bagged labelled and placed at nurses station.  Pt cooperative.  Pt has eyebrow ring that is glued on and she cannot remove.
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
## 2022-07-04 DIAGNOSIS — R45851 Suicidal ideations: Secondary | ICD-10-CM

## 2022-07-04 DIAGNOSIS — F322 Major depressive disorder, single episode, severe without psychotic features: Secondary | ICD-10-CM | POA: Diagnosis not present

## 2022-07-04 DIAGNOSIS — R4689 Other symptoms and signs involving appearance and behavior: Secondary | ICD-10-CM

## 2022-07-04 DIAGNOSIS — S61412A Laceration without foreign body of left hand, initial encounter: Secondary | ICD-10-CM | POA: Diagnosis not present

## 2022-07-04 LAB — SARS CORONAVIRUS 2 BY RT PCR: SARS Coronavirus 2 by RT PCR: NEGATIVE

## 2022-07-04 MED ORDER — FLUOXETINE HCL 10 MG PO CAPS
10.0000 mg | ORAL_CAPSULE | Freq: Every day | ORAL | Status: DC
  Administered 2022-07-04 – 2022-07-06 (×3): 10 mg via ORAL
  Filled 2022-07-04 (×4): qty 1

## 2022-07-04 MED ORDER — ACETAMINOPHEN 500 MG PO TABS
1000.0000 mg | ORAL_TABLET | Freq: Once | ORAL | Status: AC
  Administered 2022-07-04: 1000 mg via ORAL
  Filled 2022-07-04: qty 2

## 2022-07-04 MED ORDER — QUETIAPINE FUMARATE 200 MG PO TABS
600.0000 mg | ORAL_TABLET | Freq: Every day | ORAL | Status: DC
  Administered 2022-07-04 – 2022-07-05 (×2): 600 mg via ORAL
  Filled 2022-07-04: qty 2
  Filled 2022-07-04: qty 3

## 2022-07-04 MED ORDER — LISINOPRIL 5 MG PO TABS
5.0000 mg | ORAL_TABLET | Freq: Every day | ORAL | Status: DC
  Administered 2022-07-04 – 2022-07-06 (×3): 5 mg via ORAL
  Filled 2022-07-04 (×3): qty 1

## 2022-07-04 MED ORDER — HYDROXYZINE HCL 25 MG PO TABS
50.0000 mg | ORAL_TABLET | Freq: Four times a day (QID) | ORAL | Status: DC | PRN
  Administered 2022-07-04 – 2022-07-06 (×4): 50 mg via ORAL
  Filled 2022-07-04 (×4): qty 2

## 2022-07-04 MED ORDER — MELATONIN 5 MG PO TABS
10.0000 mg | ORAL_TABLET | Freq: Every day | ORAL | Status: DC
  Administered 2022-07-04 – 2022-07-05 (×2): 10 mg via ORAL
  Filled 2022-07-04 (×2): qty 2

## 2022-07-04 MED ORDER — GABAPENTIN 300 MG PO CAPS
300.0000 mg | ORAL_CAPSULE | Freq: Three times a day (TID) | ORAL | Status: DC
  Administered 2022-07-04 – 2022-07-06 (×7): 300 mg via ORAL
  Filled 2022-07-04 (×7): qty 1

## 2022-07-04 NOTE — ED Notes (Signed)
Pt has piercing in right eyebrow. Pt states it is glued in. Unable to remove piercing at this time.

## 2022-07-04 NOTE — ED Notes (Signed)
IVC/pending inpatient psych admission when medically cleared 

## 2022-07-04 NOTE — Consult Note (Signed)
[facility] Face-to-Face Psychiatry Consult   Reason for Consult:  psych evaluation Referring Physician:  Dr.Stafford Patient Identification: Hannah Reid MRN:  [id number] Principal Diagnosis: Major depressive disorder, single episode, severe without psychotic features (HCC) Diagnosis:  Principal Problem:   Major depressive disorder, single episode, severe without psychotic features (HCC) Active Problems:   Aggressive behavior   Suicidal ideation   Total Time spent with patient: 45 minutes  Subjective:  "I feel like I am two different people. One minute I'm happy the other minute I don't give a fuck and I want to died"    HPI:   Hannah Reid, 41 y.o., female patient seen health by this provider; chart reviewed and consulted with Dr. Scotty Court on 07/04/22.  On evaluation Hannah Reid reports that she has been feeling depressed for all of her life but feeling more depressed lately and has been considering suicide more seriously. Patient was really lethargic on approach nd as not easily engaged in the assessment. Per Chart review, patient was admitted to [facility] two time this year.  She is now claiming the medication is not working. Per triage note, pt reports she is very depressed, reports she is SI, denies any plan at present. Pt reports she has not eaten today. Pt denies any HI. Pt denies any visual hallucinations. Pt reports she hears some voices reports voices tell her that she is a failure. Pt reports 2 days ago she cut her left hand and has not been evaluated for.    Per TTS, Upon assessment, the pt. was extremely difficult to arouse.  Pt was in the action stage of change, expressing motivation to receive treatment. Pt was also preoccupied with thoughts of SI. Pt expressed feelings of overwhelm in her home environment. Pt explained that she is in a same-sex marriage that her family members disapprove of. Despite this disapproval, the pt. reported that her mother allowed her partner to move in  with them and that things are chaotic. Pt reported that she has no support system and that she recently quit her job due to the staff's disapproval of her lesbian lifestyle. Pt explained that she has no will to live, explaining that everyone dislikes her. Pt reported that she has been cutting and had a laceration on her hand. Pt reported feelings of hopelessness and worthlessness, explaining that she questions God about why he keeps her around. Pt reported that she attempted to jump out of the car on the way to the hospital. Pt reported having poor sleep and appetite. Pt reported that she hears command hallucinations that tell her to kill herself and to walk out in front of a car. Pt reported that he is not connected to a therapist/psychiatric but receives mental health medications from her OBGYN. Pt reported that she is med compliant but does not feel that they are effective. Pt admitted to using legal cannabis from smoke shops on a daily basis; unclear on last use. On assessment, pt. is expansive with good insight into the severity of her mental health problems. Pt reported an extensive hx of various traumas. Thought processes were relevant and intact. Pt was positive for impaired memory. Pt had impaired judgement and distorted reality testing. Pt did not appear to be responding to internal/external stimuli. Pt was oriented x4. Pt's mood was depressed/pessimistic and affect was congruent. Pt denied current HI/AV/H. Pt continued to express thoughts of SI. Pt's UDS was positive for benzodiazepine, cannabis, opiates, and tricyclics; BAL was unremarkable.  Recommendation: inpatient psychiatric  hospitalization  Past Psychiatric History:   Risk to Self:   Risk to Others:   Prior Inpatient Therapy:   Prior Outpatient Therapy:    Past Medical History:  Past Medical History:  Diagnosis Date   Hypertension     Past Surgical History:  Procedure Laterality Date   ABDOMINAL HYSTERECTOMY     CHOLECYSTECTOMY      kidney tumor removal     Family History:  Family History  Problem Relation Age of Onset   Hypertension Mother    Cancer Mother        Breast Cancer   Hypertension Father    Family Psychiatric  History:  Social History:  Social History   Substance and Sexual Activity  Alcohol Use Not Currently   Comment: socially     Social History   Substance and Sexual Activity  Drug Use Not Currently   Types: Marijuana   Comment: 5 years with no use    Social History   Socioeconomic History   Marital status: Single    Spouse name: Not on file   Number of children: Not on file   Years of education: Not on file   Highest education level: Not on file  Occupational History   Not on file  Tobacco Use   Smoking status: Every Day    Packs/day: 0.50    Years: 25.00    Total pack years: 12.50    Types: Cigarettes   Smokeless tobacco: Never  Vaping Use   Vaping Use: Every day   Substances: Flavoring  Substance and Sexual Activity   Alcohol use: Not Currently    Comment: socially   Drug use: Not Currently    Types: Marijuana    Comment: 5 years with no use   Sexual activity: Not on file  Other Topics Concern   Not on file  Social History Narrative   Not on file   Social Determinants of Health   Financial Resource Strain: Not on file  Food Insecurity: No Food Insecurity (01/21/2022)   Hunger Vital Sign    Worried About Running Out of Food in the Last Year: Never true    Ran Out of Food in the Last Year: Never true  Transportation Needs: No Transportation Needs (01/21/2022)   PRAPARE - Administrator, Civil Service (Medical): No    Lack of Transportation (Non-Medical): No  Physical Activity: Not on file  Stress: Not on file  Social Connections: Not on file   Additional Social History:    Allergies:   Allergies  Allergen Reactions   Augmentin [Amoxicillin-Pot Clavulanate]    Amoxicillin Rash    Labs:  Results for orders placed or performed during the  hospital encounter of 07/03/22 (from the past 48 hour(s))  Acetaminophen level     Status: Abnormal   Collection Time: 07/03/22  7:41 PM  Result Value Ref Range   Acetaminophen (Tylenol), Serum <10 (L) 10 - 30 ug/mL    Comment: (NOTE) Therapeutic concentrations vary significantly. A range of 10-30 ug/mL  may be an effective concentration for many patients. However, some  are best treated at concentrations outside of this range. Acetaminophen concentrations >150 ug/mL at 4 hours after ingestion  and >50 ug/mL at 12 hours after ingestion are often associated with  toxic reactions.  Performed at [facility], [address], [place], [place] [address]   Comprehensive metabolic panel     Status: Abnormal   Collection Time: 07/03/22  7:41 PM  Result Value Ref Range   Sodium 142 135 - 145 mmol/L   Potassium 4.3 3.5 - 5.1 mmol/L   Chloride 107 98 - 111 mmol/L   CO2 28 22 - 32 mmol/L   Glucose, Bld 77 70 - 99 mg/dL    Comment: Glucose reference range applies only to samples taken after fasting for at least 8 hours.   BUN 18 6 - 20 mg/dL   Creatinine, Ser 5.46 (H) 0.44 - 1.00 mg/dL   Calcium 9.4 8.9 - 27.0 mg/dL   Total Protein 7.4 6.5 - 8.1 g/dL   Albumin 4.2 3.5 - 5.0 g/dL   AST 39 15 - 41 U/L   ALT 81 (H) 0 - 44 U/L   Alkaline Phosphatase 84 38 - 126 U/L   Total Bilirubin 0.6 0.3 - 1.2 mg/dL   GFR, Estimated >35 >00 mL/min    Comment: (NOTE) Calculated using the CKD-EPI Creatinine Equation (2021)    Anion gap 7 5 - 15    Comment: Performed at [facility], [address] [place], [place] [address]  Ethanol     Status: None   Collection Time: 07/03/22  7:41 PM  Result Value Ref Range   Alcohol, Ethyl (B) <10 <10 mg/dL    Comment: (NOTE) Lowest detectable limit for serum alcohol is 10 mg/dL.  For medical purposes only. Performed at [facility], [address] [place], [place] [address]   Salicylate level     Status: Abnormal   Collection  Time: 07/03/22  7:41 PM  Result Value Ref Range   Salicylate Lvl <7.0 (L) 7.0 - 30.0 mg/dL    Comment: Performed at [facility], [address] [place], [place] [address]  CBC with Differential     Status: Abnormal   Collection Time: 07/03/22  7:41 PM  Result Value Ref Range   WBC 7.0 4.0 - 10.5 K/uL   RBC 5.19 (H) 3.87 - 5.11 MIL/uL   Hemoglobin 14.3 12.0 - 15.0 g/dL   HCT 89.3 81.0 - 17.5 %   MCV 82.3 80.0 - 100.0 fL   MCH 27.6 26.0 - 34.0 pg   MCHC 33.5 30.0 - 36.0 g/dL   RDW 10.2 58.5 - 27.7 %   Platelets 337 150 - 400 K/uL   nRBC 0.0 0.0 - 0.2 %   Neutrophils Relative % 56 %   Neutro Abs 3.8 1.7 - 7.7 K/uL   Lymphocytes Relative 35 %   Lymphs Abs 2.5 0.7 - 4.0 K/uL   Monocytes Relative 6 %   Monocytes Absolute 0.4 0.1 - 1.0 K/uL   Eosinophils Relative 2 %   Eosinophils Absolute 0.2 0.0 - 0.5 K/uL   Basophils Relative 1 %   Basophils Absolute 0.1 0.0 - 0.1 K/uL   Immature Granulocytes 0 %   Abs Immature Granulocytes 0.03 0.00 - 0.07 K/uL    Comment: Performed at [facility], [address], [place], [place] [address]  Urine Drug Screen, Qualitative     Status: Abnormal   Collection Time: 07/03/22  7:41 PM  Result Value Ref Range   Tricyclic, Ur Screen POSITIVE (A) NONE DETECTED   Amphetamines, Ur Screen NONE DETECTED NONE DETECTED   MDMA (Ecstasy)Ur Screen NONE DETECTED NONE DETECTED   Cocaine Metabolite,Ur [place] NONE DETECTED NONE DETECTED   Opiate, Ur Screen POSITIVE (A) NONE DETECTED   Phencyclidine (PCP) Ur S NONE DETECTED NONE DETECTED   Cannabinoid 50 Ng, Ur [place] POSITIVE (A) NONE DETECTED  Barbiturates, Ur Screen NONE DETECTED NONE DETECTED   Benzodiazepine, Ur Scrn POSITIVE (A) NONE DETECTED   Methadone Scn, Ur NONE DETECTED NONE DETECTED    Comment: (NOTE) Tricyclics + metabolites, urine    Cutoff 1000 ng/mL Amphetamines + metabolites, urine  Cutoff 1000 ng/mL MDMA (Ecstasy), urine              Cutoff 500 ng/mL Cocaine Metabolite, urine           Cutoff 300 ng/mL Opiate + metabolites, urine        Cutoff 300 ng/mL Phencyclidine (PCP), urine         Cutoff 25 ng/mL Cannabinoid, urine                 Cutoff 50 ng/mL Barbiturates + metabolites, urine  Cutoff 200 ng/mL Benzodiazepine, urine              Cutoff 200 ng/mL Methadone, urine                   Cutoff 300 ng/mL  The urine drug screen provides only a preliminary, unconfirmed analytical test result and should not be used for non-medical purposes. Clinical consideration and professional judgment should be applied to any positive drug screen result due to possible interfering substances. A more specific alternate chemical method must be used in order to obtain a confirmed analytical result. Gas chromatography / mass spectrometry (GC/MS) is the preferred confirm atory method. Performed at [facility], [address] [place], [place] [address]   POC urine preg, ED     Status: None   Collection Time: 07/03/22  9:57 PM  Result Value Ref Range   Preg Test, Ur NEGATIVE NEGATIVE    Comment:        THE SENSITIVITY OF THIS METHODOLOGY IS >24 mIU/mL     No current facility-administered medications for this encounter.   Current Outpatient Medications  Medication Sig Dispense Refill   FLUoxetine (PROZAC) 10 MG capsule Take 1 capsule (10 mg total) by mouth once daily. 90 capsule 1   gabapentin (NEURONTIN) 300 MG capsule Take 1 capsule (300 mg total) by mouth 3 (three) times daily. 270 capsule 1   hydrOXYzine (ATARAX) 50 MG tablet Take 1 tablet (50 mg total) by mouth every 6 (six) hours as needed. 180 tablet 1   lisinopril (ZESTRIL) 5 MG tablet Take 1 tablet (5 mg total) by mouth once daily. 90 tablet 1   QUEtiapine (SEROQUEL) 300 MG tablet Take 2 tablets (600 mg total) by mouth once nightly at bedtime. 180 tablet 1   Melatonin 10 MG TBDP Take 10 mg by mouth at bedtime. 30 tablet 1    Musculoskeletal: Strength & Muscle Tone: within normal limits Gait &  Station: normal Patient leans: N/A            Psychiatric Specialty Exam:  Presentation  General Appearance:  Appropriate for Environment  Eye Contact: Fair  Speech: Blocked  Speech Volume: Decreased  Handedness: Right   Mood and Affect  Mood: Dysphoric  Affect: Blunt   Thought Process  Thought Processes: Coherent  Descriptions of Associations:Intact  Orientation:Full (Time, Place and Person)  Thought Content:WDL  History of Schizophrenia/Schizoaffective disorder:No  Duration of Psychotic Symptoms:N/A  Hallucinations:Hallucinations: None  Ideas of Reference:None  Suicidal Thoughts:Suicidal Thoughts: Yes, Active  Homicidal Thoughts:Homicidal Thoughts: No   Sensorium  Memory: Immediate Fair  Judgment: Poor  Insight: Lacking   Executive Functions  Concentration: Poor  Attention Span: Fair  Recall:  [place] of Knowledge: Fair  Language: Fair   Psychomotor Activity  Psychomotor Activity: Psychomotor Activity: Normal   Assets  Assets: Armed forces logistics/support/administrative officer; Desire for Improvement; Housing; Leisure Time   Sleep  Sleep: Sleep: Fair   Physical Exam: Physical Exam Vitals and nursing note reviewed.  Constitutional:      Appearance: She is obese.  HENT:     Head: Normocephalic and atraumatic.     Nose: Nose normal.     Mouth/Throat:     Mouth: Mucous membranes are moist.  Eyes:     Pupils: Pupils are equal, round, and reactive to light.  Pulmonary:     Effort: Pulmonary effort is normal.  Musculoskeletal:        General: Normal range of motion.     Cervical back: Normal range of motion.  Skin:    General: Skin is warm.  Neurological:     General: No focal deficit present.  Psychiatric:        Attention and Perception: Attention normal.        Speech: Speech is delayed.        Behavior: Behavior is cooperative.        Thought Content: Thought content includes suicidal ideation.        Judgment:  Judgment is impulsive.   Review of Systems  Psychiatric/Behavioral:  Positive for depression and suicidal ideas. The patient is nervous/anxious.   All other systems reviewed and are negative.  Blood pressure (!) 123/96, pulse 95, temperature 98.4 F (36.9 C), temperature source Oral, resp. rate 18, height 5\' 4"  (1.626 m), weight 111.1 kg, SpO2 96 %. Body mass index is 42.04 kg/m.  Treatment Plan Summary: Daily contact with patient to assess and evaluate symptoms and progress in treatment and Medication management  Disposition: Recommend psychiatric Inpatient admission when medically cleared. Supportive therapy provided about ongoing stressors. Discussed crisis plan, support from social network, calling 911, coming to the Emergency Department, and calling Suicide Hotline.  Deloria Lair, NP 07/04/2022 12:11 AM

## 2022-07-04 NOTE — ED Notes (Signed)
IVC/pending psych admit  

## 2022-07-04 NOTE — ED Notes (Signed)
Pt significant  Amanda called. Updated on pt plan of care with pt permission.

## 2022-07-04 NOTE — ED Notes (Signed)
Pt given lunch tray.

## 2022-07-04 NOTE — BH Assessment (Signed)
Referral information for Psychiatric Hospitalization faxed to:  [facility] [phone]--- [phone]--- [phone]--- [phone])  [facility] [phone]),   [place] [phone])  [facility] [phone]).  [facility] [phone])  [facility] [phone])

## 2022-07-04 NOTE — ED Notes (Signed)
Pt updated on plan of care

## 2022-07-04 NOTE — ED Notes (Signed)
Pt given dinner tray.

## 2022-07-04 NOTE — BH Assessment (Signed)
Referral information for Psychiatric Hospitalization re-faxed to;   [facility] [phone]- [phone]), no beds  [facility] [phone]- [phone]),   [facility] ([phone]--[phone])  [address] [phone]),  [place] [phone], [phone], [phone] or [phone]),   [place] [facility] [phone]--- [phone]--- [phone]--- [phone])  [address] [phone]),   [facility] [phone] -or- [phone]),   [facility] [phone] phone-- [phone])  [facility] [phone])  [facility] [phone]).  [facility] [phone])

## 2022-07-04 NOTE — ED Notes (Signed)
Pt given breakfast tray

## 2022-07-04 NOTE — ED Notes (Signed)
Pt states she wishes her wife Hannah Reid to be informed of her care plan and any new updates to be given to her.

## 2022-07-04 NOTE — ED Notes (Signed)
Pt given snack. 

## 2022-07-05 MED ORDER — ACETAMINOPHEN 325 MG PO TABS
650.0000 mg | ORAL_TABLET | Freq: Once | ORAL | Status: AC
  Administered 2022-07-05: 650 mg via ORAL
  Filled 2022-07-05: qty 2

## 2022-07-05 NOTE — ED Notes (Signed)
Patient refused to take out eyebrow piercing at this time. Patient cussing and yelling at staff "Saying we are not going to [place]". "She has had her eyebrow piercing in since she has been admitted Saturday". RN and EDT addressed patient and ask could she remove it and she said no. Currently at this time patient has her eyebrow piercing in.

## 2022-07-05 NOTE — ED Notes (Signed)
Pt given a dinner meal 

## 2022-07-05 NOTE — ED Notes (Signed)
Patient given sand which tray, a cup of ice cream and a cup of soda.

## 2022-07-05 NOTE — ED Notes (Signed)
IVC/  PENDING  PLACEMENT 

## 2022-07-05 NOTE — ED Notes (Signed)
Patient offered to be placed in clean room (23) after making multiple statements about being abandoned in the hallway for two days (assured she has not been). Patient then refuses to be placed in room because she has "been here for two days might as well stay". Remains in [facility].

## 2022-07-05 NOTE — ED Notes (Signed)
Pt. To BHU from ED ambulatory without difficulty, to room  BHU 3. Report from Amy RN. Pt. Is alert and oriented, warm and dry in no distress. Pt. Denies SI, HI, and AVH. Pt. Calm and cooperative. Pt. Made aware of security cameras and Q15 minute rounds. Pt. Encouraged to let Nursing staff know of any concerns or needs.   ENVIRONMENTAL ASSESSMENT Potentially harmful objects out of patient reach: Yes.   Personal belongings secured: Yes.   Patient dressed in hospital provided attire only: Yes.   Plastic bags out of patient reach: Yes.   Patient care equipment (cords, cables, call bells, lines, and drains) shortened, removed, or accounted for: Yes.   Equipment and supplies removed from bottom of stretcher: Yes.   Potentially toxic materials out of patient reach: Yes.   Sharps container removed or out of patient reach: Yes.

## 2022-07-05 NOTE — ED Notes (Signed)
Patient requested a bath at 5:50pm. Patinet stated " She was not offered a bath this morning. Patient requested a bath for in the morning.

## 2022-07-05 NOTE — ED Notes (Signed)
Resumed care from [facility].  Pt requesting her meds and not understanding why she can't leave.  This rn explained to pt she is IVC and waiting for placement.  Pt understands

## 2022-07-05 NOTE — ED Notes (Signed)
Meds given.   Pt had an ouburst because she overheard this rn and ed tech speaking about her body piercing to right eyebrow that has not been removed even though pt has been here for 3 days.  Pt states she is not removing the body piercing from right eyebrow.

## 2022-07-05 NOTE — ED Notes (Signed)
Report off to kim rn bhu nurse   

## 2022-07-05 NOTE — Consult Note (Signed)
[facility] Psych ED Progress Note  07/05/2022 5:38 PM Hannah Reid  MRN:  [id number]   Method of visit?: Face to Face   Subjective:  "I still feel suicidal."  Patient continues to report suicidal thoughts. She reports that she did not follow up with mental health providers outpatient when she was discharged from inpatient in March of 2023. Writer discussed the importance of this.     Principal Problem: Major depressive disorder, single episode, severe without psychotic features (HCC) Diagnosis:  Principal Problem:   Major depressive disorder, single episode, severe without psychotic features (HCC) Active Problems:   Suicidal ideation   Aggressive behavior  Total Time spent with patient: 20 minutes  Past Psychiatric History: see previous  Past Medical History:  Past Medical History:  Diagnosis Date   Hypertension     Past Surgical History:  Procedure Laterality Date   ABDOMINAL HYSTERECTOMY     CHOLECYSTECTOMY     kidney tumor removal     Family History:  Family History  Problem Relation Age of Onset   Hypertension Mother    Cancer Mother        Breast Cancer   Hypertension Father    Family Psychiatric  History: see previous Social History:  Social History   Substance and Sexual Activity  Alcohol Use Not Currently   Comment: socially     Social History   Substance and Sexual Activity  Drug Use Not Currently   Types: Marijuana   Comment: 5 years with no use    Social History   Socioeconomic History   Marital status: Single    Spouse name: Not on file   Number of children: Not on file   Years of education: Not on file   Highest education level: Not on file  Occupational History   Not on file  Tobacco Use   Smoking status: Every Day    Packs/day: 0.50    Years: 25.00    Total pack years: 12.50    Types: Cigarettes   Smokeless tobacco: Never  Vaping Use   Vaping Use: Every day   Substances: Flavoring  Substance and Sexual Activity   Alcohol use:  Not Currently    Comment: socially   Drug use: Not Currently    Types: Marijuana    Comment: 5 years with no use   Sexual activity: Not on file  Other Topics Concern   Not on file  Social History Narrative   Not on file   Social Determinants of Health   Financial Resource Strain: Not on file  Food Insecurity: No Food Insecurity (01/21/2022)   Hunger Vital Sign    Worried About Running Out of Food in the Last Year: Never true    Ran Out of Food in the Last Year: Never true  Transportation Needs: No Transportation Needs (01/21/2022)   PRAPARE - Administrator, Civil Service (Medical): No    Lack of Transportation (Non-Medical): No  Physical Activity: Not on file  Stress: Not on file  Social Connections: Not on file    Sleep: Fair  Appetite:  Good  Current Medications: Current Facility-Administered Medications  Medication Dose Route Frequency Provider Last Rate Last Admin   FLUoxetine (PROZAC) capsule 10 mg  10 mg Oral Daily Charm Rings, NP   10 mg at 07/05/22 0917   gabapentin (NEURONTIN) capsule 300 mg  300 mg Oral TID Charm Rings, NP   300 mg at 07/05/22 1551   hydrOXYzine (ATARAX) tablet  50 mg  50 mg Oral Q6H PRN Patrecia Pour, NP   50 mg at 07/05/22 1552   lisinopril (ZESTRIL) tablet 5 mg  5 mg Oral Daily Patrecia Pour, NP   5 mg at 07/05/22 9678   melatonin tablet 10 mg  10 mg Oral QHS Patrecia Pour, NP   10 mg at 07/04/22 2104   QUEtiapine (SEROQUEL) tablet 600 mg  600 mg Oral QHS Patrecia Pour, NP   600 mg at 07/04/22 2104   Current Outpatient Medications  Medication Sig Dispense Refill   FLUoxetine (PROZAC) 10 MG capsule Take 1 capsule (10 mg total) by mouth once daily. 90 capsule 1   gabapentin (NEURONTIN) 300 MG capsule Take 1 capsule (300 mg total) by mouth 3 (three) times daily. 270 capsule 1   hydrOXYzine (ATARAX) 50 MG tablet Take 1 tablet (50 mg total) by mouth every 6 (six) hours as needed. 180 tablet 1   lisinopril (ZESTRIL) 5 MG  tablet Take 1 tablet (5 mg total) by mouth once daily. 90 tablet 1   QUEtiapine (SEROQUEL) 300 MG tablet Take 2 tablets (600 mg total) by mouth once nightly at bedtime. 180 tablet 1   Melatonin 10 MG TBDP Take 10 mg by mouth at bedtime. 30 tablet 1    Lab Results:  Results for orders placed or performed during the hospital encounter of 07/03/22 (from the past 48 hour(s))  Acetaminophen level     Status: Abnormal   Collection Time: 07/03/22  7:41 PM  Result Value Ref Range   Acetaminophen (Tylenol), Serum <10 (L) 10 - 30 ug/mL    Comment: (NOTE) Therapeutic concentrations vary significantly. A range of 10-30 ug/mL  may be an effective concentration for many patients. However, some  are best treated at concentrations outside of this range. Acetaminophen concentrations >150 ug/mL at 4 hours after ingestion  and >50 ug/mL at 12 hours after ingestion are often associated with  toxic reactions.  Performed at [facility], [address] [place], [place] [address]   Comprehensive metabolic panel     Status: Abnormal   Collection Time: 07/03/22  7:41 PM  Result Value Ref Range   Sodium 142 135 - 145 mmol/L   Potassium 4.3 3.5 - 5.1 mmol/L   Chloride 107 98 - 111 mmol/L   CO2 28 22 - 32 mmol/L   Glucose, Bld 77 70 - 99 mg/dL    Comment: Glucose reference range applies only to samples taken after fasting for at least 8 hours.   BUN 18 6 - 20 mg/dL   Creatinine, Ser 1.06 (H) 0.44 - 1.00 mg/dL   Calcium 9.4 8.9 - 10.3 mg/dL   Total Protein 7.4 6.5 - 8.1 g/dL   Albumin 4.2 3.5 - 5.0 g/dL   AST 39 15 - 41 U/L   ALT 81 (H) 0 - 44 U/L   Alkaline Phosphatase 84 38 - 126 U/L   Total Bilirubin 0.6 0.3 - 1.2 mg/dL   GFR, Estimated >60 >60 mL/min    Comment: (NOTE) Calculated using the CKD-EPI Creatinine Equation (2021)    Anion gap 7 5 - 15    Comment: Performed at [facility], [address] [place], [place] [address]  Ethanol     Status: None   Collection Time:  07/03/22  7:41 PM  Result Value Ref Range   Alcohol, Ethyl (B) <10 <10 mg/dL    Comment: (NOTE) Lowest detectable limit for serum alcohol is 10  mg/dL.  For medical purposes only. Performed at [facility], [address] [place], [place] [address]   Salicylate level     Status: Abnormal   Collection Time: 07/03/22  7:41 PM  Result Value Ref Range   Salicylate Lvl <7.0 (L) 7.0 - 30.0 mg/dL    Comment: Performed at [facility], [address] [place], [place] [address]  CBC with Differential     Status: Abnormal   Collection Time: 07/03/22  7:41 PM  Result Value Ref Range   WBC 7.0 4.0 - 10.5 K/uL   RBC 5.19 (H) 3.87 - 5.11 MIL/uL   Hemoglobin 14.3 12.0 - 15.0 g/dL   HCT 82.942.7 56.236.0 - 13.046.0 %   MCV 82.3 80.0 - 100.0 fL   MCH 27.6 26.0 - 34.0 pg   MCHC 33.5 30.0 - 36.0 g/dL   RDW 86.512.4 78.411.5 - 69.615.5 %   Platelets 337 150 - 400 K/uL   nRBC 0.0 0.0 - 0.2 %   Neutrophils Relative % 56 %   Neutro Abs 3.8 1.7 - 7.7 K/uL   Lymphocytes Relative 35 %   Lymphs Abs 2.5 0.7 - 4.0 K/uL   Monocytes Relative 6 %   Monocytes Absolute 0.4 0.1 - 1.0 K/uL   Eosinophils Relative 2 %   Eosinophils Absolute 0.2 0.0 - 0.5 K/uL   Basophils Relative 1 %   Basophils Absolute 0.1 0.0 - 0.1 K/uL   Immature Granulocytes 0 %   Abs Immature Granulocytes 0.03 0.00 - 0.07 K/uL    Comment: Performed at [facility], [address] [place], [place] [address]  Urine Drug Screen, Qualitative     Status: Abnormal   Collection Time: 07/03/22  7:41 PM  Result Value Ref Range   Tricyclic, Ur Screen POSITIVE (A) NONE DETECTED   Amphetamines, Ur Screen NONE DETECTED NONE DETECTED   MDMA (Ecstasy)Ur Screen NONE DETECTED NONE DETECTED   Cocaine Metabolite,Ur [place] NONE DETECTED NONE DETECTED   Opiate, Ur Screen POSITIVE (A) NONE DETECTED   Phencyclidine (PCP) Ur S NONE DETECTED NONE DETECTED   Cannabinoid 50 Ng, Ur [place] POSITIVE (A) NONE DETECTED   Barbiturates, Ur Screen NONE DETECTED  NONE DETECTED   Benzodiazepine, Ur Scrn POSITIVE (A) NONE DETECTED   Methadone Scn, Ur NONE DETECTED NONE DETECTED    Comment: (NOTE) Tricyclics + metabolites, urine    Cutoff 1000 ng/mL Amphetamines + metabolites, urine  Cutoff 1000 ng/mL MDMA (Ecstasy), urine              Cutoff 500 ng/mL Cocaine Metabolite, urine          Cutoff 300 ng/mL Opiate + metabolites, urine        Cutoff 300 ng/mL Phencyclidine (PCP), urine         Cutoff 25 ng/mL Cannabinoid, urine                 Cutoff 50 ng/mL Barbiturates + metabolites, urine  Cutoff 200 ng/mL Benzodiazepine, urine              Cutoff 200 ng/mL Methadone, urine                   Cutoff 300 ng/mL  The urine drug screen provides only a preliminary, unconfirmed analytical test result and should not be used for non-medical purposes. Clinical consideration and professional judgment should be applied to any positive drug screen result due to possible interfering substances. A more specific alternate chemical method must be  used in order to obtain a confirmed analytical result. Gas chromatography / mass spectrometry (GC/MS) is the preferred confirm atory method. Performed at [facility] [address] [place], [place] [address]   POC urine preg, ED     Status: None   Collection Time: 07/03/22  9:57 PM  Result Value Ref Range   Preg Test, Ur NEGATIVE NEGATIVE    Comment:        THE SENSITIVITY OF THIS METHODOLOGY IS >24 mIU/mL   SARS Coronavirus 2 by RT PCR (hospital order, performed in [facility] hospital lab) *cepheid single result test* Anterior Nasal Swab     Status: None   Collection Time: 07/03/22 11:19 PM   Specimen: Anterior Nasal Swab  Result Value Ref Range   SARS Coronavirus 2 by RT PCR NEGATIVE NEGATIVE    Comment: (NOTE) SARS-CoV-2 target nucleic acids are NOT DETECTED.  The SARS-CoV-2 RNA is generally detectable in upper and lower respiratory specimens during the acute phase of infection. The  lowest concentration of SARS-CoV-2 viral copies this assay can detect is 250 copies / mL. A negative result does not preclude SARS-CoV-2 infection and should not be used as the sole basis for treatment or other patient management decisions.  A negative result may occur with improper specimen collection / handling, submission of specimen other than nasopharyngeal swab, presence of viral mutation(s) within the areas targeted by this assay, and inadequate number of viral copies (<250 copies / mL). A negative result must be combined with clinical observations, patient history, and epidemiological information.  Fact Sheet for Patients:   RoadLapTop.co.za  Fact Sheet for Healthcare Providers: http://kim-miller.com/  This test is not yet approved or  cleared by the [place] FDA and has been authorized for detection and/or diagnosis of SARS-CoV-2 by FDA under an Emergency Use Authorization (EUA).  This EUA will remain in effect (meaning this test can be used) for the duration of the COVID-19 declaration under Section 564(b)(1) of the Act, 21 U.S.C. section 360bbb-3(b)(1), unless the authorization is terminated or revoked sooner.  Performed at [facility], [address] [place], [place] [address]     Blood Alcohol level:  Lab Results  Component Value Date   [facility] <10 07/03/2022   ETH <10 12/22/2021    Physical Findings: AIMS:  , ,  ,  ,    CIWA:    COWS:     Musculoskeletal: Strength & Muscle Tone: within normal limits Gait & Station: normal Patient leans: N/A  Psychiatric Specialty Exam:  Presentation  General Appearance:  Casual  Eye Contact: Good  Speech: Clear and Coherent  Speech Volume: Normal  Handedness: Right   Mood and Affect  Mood: Depressed  Affect: Blunt   Thought Process  Thought Processes: Coherent  Descriptions of Associations:Intact  Orientation:Full (Time, Place and  Person)  Thought Content:WDL  History of Schizophrenia/Schizoaffective disorder:No  Duration of Psychotic Symptoms:N/A  Hallucinations:No data recorded Ideas of Reference:None  Suicidal Thoughts:Suicidal Thoughts: Yes, Active ("no will to live; will to live; I will step out in front of a car") SI Active Intent and/or Plan: With Intent  Homicidal Thoughts:Homicidal Thoughts: No   Sensorium  Memory: Immediate Fair  Judgment: Poor  Insight: Poor   Executive Functions  Concentration: Fair  Attention Span: Fair  Recall: Fair  Fund of Knowledge: Fair  Language: Fair   Psychomotor Activity  Psychomotor Activity:Psychomotor Activity: Normal   Assets  Assets: Desire for Improvement; Housing   Sleep  Sleep:Sleep: Fair  Physical Exam: Physical Exam Vitals and nursing note reviewed.  HENT:     Head: Normocephalic.     Nose: No congestion or rhinorrhea.  Eyes:     General:        Right eye: No discharge.        Left eye: No discharge.  Cardiovascular:     Rate and Rhythm: Normal rate.  Pulmonary:     Effort: Pulmonary effort is normal.  Musculoskeletal:        General: Normal range of motion.     Cervical back: Normal range of motion.  Skin:    General: Skin is dry.  Neurological:     Mental Status: She is alert and oriented to person, place, and time.  Psychiatric:        Attention and Perception: Attention normal.        Mood and Affect: Affect is blunt.        Speech: Speech normal.        Behavior: Behavior is cooperative.        Thought Content: Thought content includes suicidal ideation.    Review of Systems  Constitutional: Negative.   HENT: Negative.    Respiratory: Negative.    Psychiatric/Behavioral:  Positive for depression, substance abuse and suicidal ideas. Negative for hallucinations and memory loss. The patient is nervous/anxious. The patient does not have insomnia.    Blood pressure 111/60, pulse 82, temperature 98 F  (36.7 C), resp. rate 17, height 5\' 4"  (1.626 m), weight 111.1 kg, SpO2 96 %. Body mass index is 42.04 kg/m.  Treatment Plan Summary: Daily contact with patient to assess and evaluate symptoms and progress in treatment and Medication management. Inpatient psychiatric hospitalization continues to be recommended.   , NP 07/05/2022, 5:38 PM

## 2022-07-06 ENCOUNTER — Inpatient Hospital Stay (HOSPITAL_COMMUNITY)
Admission: AD | Admit: 2022-07-06 | Discharge: 2022-07-09 | DRG: 885 | Disposition: A | Discharge status: Home / Self Care | Payer: Federal, State, Local not specified - Other | Source: Intra-hospital | Attending: Psychiatry | Admitting: Psychiatry

## 2022-07-06 ENCOUNTER — Other Ambulatory Visit: Payer: Self-pay

## 2022-07-06 ENCOUNTER — Encounter (HOSPITAL_COMMUNITY): Payer: Self-pay | Admitting: Psychiatry

## 2022-07-06 DIAGNOSIS — Z8249 Family history of ischemic heart disease and other diseases of the circulatory system: Secondary | ICD-10-CM | POA: Diagnosis not present

## 2022-07-06 DIAGNOSIS — F1320 Sedative, hypnotic or anxiolytic dependence, uncomplicated: Secondary | ICD-10-CM | POA: Diagnosis present

## 2022-07-06 DIAGNOSIS — Z8543 Personal history of malignant neoplasm of ovary: Secondary | ICD-10-CM

## 2022-07-06 DIAGNOSIS — F411 Generalized anxiety disorder: Secondary | ICD-10-CM | POA: Diagnosis present

## 2022-07-06 DIAGNOSIS — F17210 Nicotine dependence, cigarettes, uncomplicated: Secondary | ICD-10-CM | POA: Diagnosis present

## 2022-07-06 DIAGNOSIS — Z808 Family history of malignant neoplasm of other organs or systems: Secondary | ICD-10-CM | POA: Diagnosis not present

## 2022-07-06 DIAGNOSIS — I10 Essential (primary) hypertension: Secondary | ICD-10-CM | POA: Diagnosis present

## 2022-07-06 DIAGNOSIS — K0510 Chronic gingivitis, plaque induced: Secondary | ICD-10-CM | POA: Diagnosis present

## 2022-07-06 DIAGNOSIS — F332 Major depressive disorder, recurrent severe without psychotic features: Principal | ICD-10-CM | POA: Diagnosis present

## 2022-07-06 DIAGNOSIS — Z79899 Other long term (current) drug therapy: Secondary | ICD-10-CM

## 2022-07-06 DIAGNOSIS — F1190 Opioid use, unspecified, uncomplicated: Secondary | ICD-10-CM | POA: Insufficient documentation

## 2022-07-06 DIAGNOSIS — R45851 Suicidal ideations: Secondary | ICD-10-CM | POA: Diagnosis present

## 2022-07-06 DIAGNOSIS — Z880 Allergy status to penicillin: Secondary | ICD-10-CM

## 2022-07-06 DIAGNOSIS — F1120 Opioid dependence, uncomplicated: Secondary | ICD-10-CM | POA: Diagnosis present

## 2022-07-06 DIAGNOSIS — F1210 Cannabis abuse, uncomplicated: Secondary | ICD-10-CM | POA: Diagnosis present

## 2022-07-06 DIAGNOSIS — Z91410 Personal history of adult physical and sexual abuse: Secondary | ICD-10-CM | POA: Diagnosis not present

## 2022-07-06 DIAGNOSIS — G4700 Insomnia, unspecified: Secondary | ICD-10-CM | POA: Diagnosis present

## 2022-07-06 DIAGNOSIS — Z803 Family history of malignant neoplasm of breast: Secondary | ICD-10-CM | POA: Diagnosis not present

## 2022-07-06 DIAGNOSIS — F1290 Cannabis use, unspecified, uncomplicated: Secondary | ICD-10-CM | POA: Insufficient documentation

## 2022-07-06 DIAGNOSIS — Z888 Allergy status to other drugs, medicaments and biological substances status: Secondary | ICD-10-CM

## 2022-07-06 LAB — URINALYSIS, COMPLETE (UACMP) WITH MICROSCOPIC
Bacteria, UA: NONE SEEN
Bilirubin Urine: NEGATIVE
Glucose, UA: NEGATIVE mg/dL
Hgb urine dipstick: NEGATIVE
Ketones, ur: NEGATIVE mg/dL
Leukocytes,Ua: NEGATIVE
Nitrite: NEGATIVE
Protein, ur: NEGATIVE mg/dL
Specific Gravity, Urine: 1.018 (ref 1.005–1.030)
pH: 5 (ref 5.0–8.0)

## 2022-07-06 LAB — PREGNANCY, URINE: Preg Test, Ur: NEGATIVE

## 2022-07-06 LAB — RAPID URINE DRUG SCREEN, HOSP PERFORMED
Amphetamines: NOT DETECTED
Barbiturates: NOT DETECTED
Benzodiazepines: POSITIVE — AB
Cocaine: NOT DETECTED
Opiates: NOT DETECTED
Tetrahydrocannabinol: POSITIVE — AB

## 2022-07-06 MED ORDER — HYDROXYZINE HCL 25 MG PO TABS
25.0000 mg | ORAL_TABLET | Freq: Three times a day (TID) | ORAL | Status: DC | PRN
  Administered 2022-07-06: 25 mg via ORAL
  Filled 2022-07-06: qty 1

## 2022-07-06 MED ORDER — FLUOXETINE HCL 10 MG PO CAPS
10.0000 mg | ORAL_CAPSULE | Freq: Every day | ORAL | Status: DC
  Administered 2022-07-07 – 2022-07-09 (×3): 10 mg via ORAL
  Filled 2022-07-06 (×2): qty 1
  Filled 2022-07-06: qty 7
  Filled 2022-07-06 (×3): qty 1

## 2022-07-06 MED ORDER — LISINOPRIL 5 MG PO TABS
5.0000 mg | ORAL_TABLET | Freq: Every day | ORAL | Status: DC
  Administered 2022-07-07: 5 mg via ORAL
  Filled 2022-07-06 (×3): qty 1

## 2022-07-06 MED ORDER — TRAZODONE HCL 50 MG PO TABS: 50.0000 mg | ORAL_TABLET | Freq: Every evening | ORAL | Status: DC | PRN

## 2022-07-06 MED ORDER — MAGNESIUM HYDROXIDE 400 MG/5ML PO SUSP: 30.0000 mL | Freq: Every day | ORAL | Status: DC | PRN

## 2022-07-06 MED ORDER — ALUM & MAG HYDROXIDE-SIMETH 200-200-20 MG/5ML PO SUSP: 30.0000 mL | ORAL | Status: DC | PRN

## 2022-07-06 MED ORDER — MELATONIN 5 MG PO TABS
10.0000 mg | ORAL_TABLET | Freq: Every day | ORAL | Status: DC
  Administered 2022-07-06 – 2022-07-08 (×3): 10 mg via ORAL
  Filled 2022-07-06 (×2): qty 2
  Filled 2022-07-06: qty 14
  Filled 2022-07-06 (×4): qty 2

## 2022-07-06 MED ORDER — ACETAMINOPHEN 325 MG PO TABS
650.0000 mg | ORAL_TABLET | Freq: Four times a day (QID) | ORAL | Status: DC | PRN
  Administered 2022-07-07 – 2022-07-08 (×3): 650 mg via ORAL
  Filled 2022-07-06 (×3): qty 2

## 2022-07-06 MED ORDER — QUETIAPINE FUMARATE 300 MG PO TABS
600.0000 mg | ORAL_TABLET | Freq: Every day | ORAL | Status: DC
  Administered 2022-07-06: 600 mg via ORAL
  Filled 2022-07-06 (×4): qty 2

## 2022-07-06 MED ORDER — HYDROXYZINE HCL 50 MG PO TABS
50.0000 mg | ORAL_TABLET | Freq: Four times a day (QID) | ORAL | Status: DC | PRN
  Administered 2022-07-07 – 2022-07-09 (×3): 50 mg via ORAL
  Filled 2022-07-06: qty 10
  Filled 2022-07-06 (×3): qty 1

## 2022-07-06 MED ORDER — GABAPENTIN 300 MG PO CAPS
300.0000 mg | ORAL_CAPSULE | Freq: Three times a day (TID) | ORAL | Status: DC
  Administered 2022-07-06 – 2022-07-09 (×9): 300 mg via ORAL
  Filled 2022-07-06: qty 1
  Filled 2022-07-06 (×2): qty 21
  Filled 2022-07-06 (×5): qty 1
  Filled 2022-07-06: qty 21
  Filled 2022-07-06 (×5): qty 1

## 2022-07-06 NOTE — ED Notes (Signed)
Lunch tray and beverage provided 

## 2022-07-06 NOTE — ED Notes (Signed)
[facility]  Dept  called  for  transport  to  [facility]  beh  med

## 2022-07-06 NOTE — BH Assessment (Signed)
Patient to be reviewed with [facility] per [facility]. AC requested IVC paperwork to faxed. This Probation officer faxed IVC paperwork at 6:25am.  Dayshift to follow-up

## 2022-07-06 NOTE — BH Assessment (Signed)
Patient has been accepted to [facility] today 07/06/22 after 2:00pm.  Patient assigned to room 407, bed# 1. Accepting physician is Dr. Caswell Corwin.  Call report to [phone].  Representative was [employer].   ER Staff is aware of it:  Anne Ng, ER Secretary  Dr. Cheri Fowler, ER MD  Abigail Butts, Patient's Nurse

## 2022-07-06 NOTE — BH Assessment (Signed)
Writer faxed IVC paperwork to [place] 336 [phone]

## 2022-07-06 NOTE — ED Notes (Signed)
Report given to [place]

## 2022-07-06 NOTE — Tx Team (Signed)
Initial Treatment Plan 07/06/2022 4:50 PM [name] [id number]    PATIENT STRESSORS: Financial difficulties   Health problems   Marital or family conflict   Medication change or noncompliance     PATIENT STRENGTHS: Ability for insight  Capable of independent living  Communication skills  Motivation for treatment/growth    PATIENT IDENTIFIED PROBLEMS: "Lost job due to discrimination"  "Mom is terminally ill"  Medication noncompliance  Depression  Suicidal ideation             DISCHARGE CRITERIA:  Ability to meet basic life and health needs Adequate post-discharge living arrangements Motivation to continue treatment in a less acute level of care  PRELIMINARY DISCHARGE PLAN: Attend aftercare/continuing care group Attend 12-step recovery group Outpatient therapy Return to previous living arrangement  PATIENT/FAMILY INVOLVEMENT: This treatment plan has been presented to and reviewed with the patient, Hannah Reid.  The patient and family have been given the opportunity to ask questions and make suggestions.  Vela Prose, RN 07/06/2022, 4:50 PM

## 2022-07-06 NOTE — ED Provider Notes (Signed)
Emergency Medicine Observation Re-evaluation Note  ASHLINN HEMRICK is a 41 y.o. female, seen on rounds today.  Pt initially presented to the ED for complaints of Psychiatric Evaluation Currently, the patient is resting comfortably.  Physical Exam  BP (!) 141/98 (BP Location: Left Arm)   Pulse 80   Temp 98.5 F (36.9 C) (Oral)   Resp 16   Ht 5\' 4"  (1.626 m)   Wt 111.1 kg   SpO2 96%   BMI 42.04 kg/m  Physical Exam General: No acute distress Cardiac: Well-perfused extremities Lungs: No respiratory distress Psych: Appropriate mood and affect  ED Course / MDM  EKG:   I have reviewed the labs performed to date as well as medications administered while in observation.  Recent changes in the last 24 hours include none.  Plan  Current plan is for placement.    Naaman Plummer, MD 07/06/22 [phone]

## 2022-07-06 NOTE — Progress Notes (Signed)
Admission Note: Patient is a 41 year old female admitted to the unit under IVC from [facility] for depression, suicidal ideation,substance abuse and medication noncompliance.  Patient currently denies suicidal ideation.  Stated she had a cut on her left arm after falling from her front porch and that cut is not SI attempt.  Patient reports stressors: mom is terminally ill and she is given six months to live, recently loss her job due to discrimination, and financial difficulties.  Stated she is here to get back on her medications.  Patient presents with a flat affect and depressed mood.  Admission plan of care reviewed, consent signed.  Skin and personal belongings completed.  Skin is dry with laceration noted on left hand.  No contraband found during search.  Patient oriented to the unit, staff and room.  Routine safety checks initiated.  Verbalizes understanding of unit rules/protocols.  Patient is safe on the unit.

## 2022-07-06 NOTE — ED Notes (Signed)
EMTALA reviewed and all required documents are up to date. Pt is ready for transport.

## 2022-07-06 NOTE — ED Notes (Signed)
Ivc /pending placement 

## 2022-07-06 NOTE — ED Notes (Signed)
Patient denies Si/hi or avh, she is pleasant, no signs of distress, states that she has a cut on her hand that she did not do intentionally do or try to harm herself, she talked about how she lost her job recently, and she feels stress about finances, but that she has support of family. Patient is safe, contracts for safety, will continue to monitor. Q 15 minute checks and camera surveillance in progress for safety.

## 2022-07-06 NOTE — ED Notes (Signed)
Breakfast tray and beverage provided 

## 2022-07-06 NOTE — Progress Notes (Signed)
The patient rated her day as a 0 out of 10 since she is going through "BS". Prior to group , she complained that she has been waiting for hours to receive her medication.

## 2022-07-06 NOTE — ED Notes (Signed)
Patient sitting in dayroom, talking with another Patient, she is calm and pleasant, no behavioral issues, she did eat 100% of lunch and beverage,will continue to monitor.

## 2022-07-07 ENCOUNTER — Encounter (HOSPITAL_COMMUNITY): Payer: Self-pay

## 2022-07-07 DIAGNOSIS — F1320 Sedative, hypnotic or anxiolytic dependence, uncomplicated: Secondary | ICD-10-CM | POA: Insufficient documentation

## 2022-07-07 DIAGNOSIS — F411 Generalized anxiety disorder: Secondary | ICD-10-CM | POA: Insufficient documentation

## 2022-07-07 DIAGNOSIS — F332 Major depressive disorder, recurrent severe without psychotic features: Secondary | ICD-10-CM | POA: Diagnosis not present

## 2022-07-07 DIAGNOSIS — F1190 Opioid use, unspecified, uncomplicated: Secondary | ICD-10-CM | POA: Insufficient documentation

## 2022-07-07 DIAGNOSIS — F1290 Cannabis use, unspecified, uncomplicated: Secondary | ICD-10-CM | POA: Insufficient documentation

## 2022-07-07 LAB — COMPREHENSIVE METABOLIC PANEL
ALT: 63 U/L — ABNORMAL HIGH (ref 0–44)
AST: 33 U/L (ref 15–41)
Albumin: 4.1 g/dL (ref 3.5–5.0)
Alkaline Phosphatase: 74 U/L (ref 38–126)
Anion gap: 7 (ref 5–15)
BUN: 22 mg/dL — ABNORMAL HIGH (ref 6–20)
CO2: 25 mmol/L (ref 22–32)
Calcium: 9.6 mg/dL (ref 8.9–10.3)
Chloride: 105 mmol/L (ref 98–111)
Creatinine, Ser: 1.07 mg/dL — ABNORMAL HIGH (ref 0.44–1.00)
GFR, Estimated: >60 mL/min (ref >60)
Glucose, Bld: 112 mg/dL — ABNORMAL HIGH (ref 70–99)
Potassium: 3.6 mmol/L (ref 3.5–5.1)
Sodium: 137 mmol/L (ref 135–145)
Total Bilirubin: 0.5 mg/dL (ref 0.3–1.2)
Total Protein: 7.4 g/dL (ref 6.5–8.1)

## 2022-07-07 LAB — TSH: TSH: 3.965 u[IU]/mL (ref 0.350–4.500)

## 2022-07-07 LAB — CBC
HCT: 43.5 % (ref 36.0–46.0)
Hemoglobin: 14.5 g/dL (ref 12.0–15.0)
MCH: 27.6 pg (ref 26.0–34.0)
MCHC: 33.3 g/dL (ref 30.0–36.0)
MCV: 82.7 fL (ref 80.0–100.0)
Platelets: 305 10*3/uL (ref 150–400)
RBC: 5.26 MIL/uL — ABNORMAL HIGH (ref 3.87–5.11)
RDW: 12.8 % (ref 11.5–15.5)
WBC: 7 10*3/uL (ref 4.0–10.5)
nRBC: 0 % (ref 0.0–0.2)

## 2022-07-07 LAB — HEMOGLOBIN A1C
Hgb A1c MFr Bld: 5.1 % (ref 4.8–5.6)
Mean Plasma Glucose: 99.67 mg/dL

## 2022-07-07 LAB — LIPID PANEL
Cholesterol: 312 mg/dL — ABNORMAL HIGH (ref 0–200)
HDL: 45 mg/dL (ref >40)
LDL Cholesterol: 201 mg/dL — ABNORMAL HIGH (ref 0–99)
Total CHOL/HDL Ratio: 6.9 RATIO
Triglycerides: 330 mg/dL — ABNORMAL HIGH (ref <150)
VLDL: 66 mg/dL — ABNORMAL HIGH (ref 0–40)

## 2022-07-07 MED ORDER — CLONIDINE HCL 0.1 MG PO TABS
0.1000 mg | ORAL_TABLET | Freq: Every day | ORAL | Status: AC
  Administered 2022-07-07: 0.1 mg via ORAL
  Filled 2022-07-07 (×2): qty 1

## 2022-07-07 MED ORDER — QUETIAPINE FUMARATE ER 50 MG PO TB24
50.0000 mg | ORAL_TABLET | Freq: Every day | ORAL | Status: DC
  Administered 2022-07-07 – 2022-07-09 (×3): 50 mg via ORAL
  Filled 2022-07-07 (×3): qty 1
  Filled 2022-07-07: qty 7
  Filled 2022-07-07: qty 1

## 2022-07-07 MED ORDER — LOPERAMIDE HCL 2 MG PO CAPS: 2.0000 mg | ORAL_CAPSULE | ORAL | Status: DC | PRN

## 2022-07-07 MED ORDER — ONDANSETRON 4 MG PO TBDP: 4.0000 mg | ORAL_TABLET | Freq: Four times a day (QID) | ORAL | Status: DC | PRN

## 2022-07-07 MED ORDER — NICOTINE POLACRILEX 2 MG MT GUM
2.0000 mg | CHEWING_GUM | OROMUCOSAL | Status: DC | PRN
  Administered 2022-07-07 – 2022-07-08 (×3): 2 mg via ORAL
  Filled 2022-07-07 (×3): qty 1

## 2022-07-07 MED ORDER — QUETIAPINE FUMARATE 300 MG PO TABS
300.0000 mg | ORAL_TABLET | Freq: Every day | ORAL | Status: DC
  Administered 2022-07-07 – 2022-07-08 (×2): 300 mg via ORAL
  Filled 2022-07-07: qty 7
  Filled 2022-07-07 (×4): qty 1

## 2022-07-07 MED ORDER — METHOCARBAMOL 500 MG PO TABS: 500.0000 mg | ORAL_TABLET | Freq: Three times a day (TID) | ORAL | Status: DC | PRN

## 2022-07-07 MED ORDER — CLONIDINE HCL 0.1 MG PO TABS: 0.1000 mg | ORAL_TABLET | ORAL | Status: DC | PRN

## 2022-07-07 MED ORDER — NAPROXEN 500 MG PO TABS
500.0000 mg | ORAL_TABLET | Freq: Two times a day (BID) | ORAL | Status: DC | PRN
  Administered 2022-07-07 – 2022-07-08 (×3): 500 mg via ORAL
  Filled 2022-07-07 (×3): qty 1

## 2022-07-07 MED ORDER — LISINOPRIL 10 MG PO TABS
10.0000 mg | ORAL_TABLET | Freq: Every day | ORAL | Status: DC
  Administered 2022-07-08 – 2022-07-09 (×2): 10 mg via ORAL
  Filled 2022-07-07 (×3): qty 1

## 2022-07-07 MED ORDER — LORAZEPAM 1 MG PO TABS: 1.0000 mg | ORAL_TABLET | Freq: Four times a day (QID) | ORAL | Status: DC | PRN

## 2022-07-07 MED ORDER — DICYCLOMINE HCL 20 MG PO TABS: 20.0000 mg | ORAL_TABLET | Freq: Four times a day (QID) | ORAL | Status: DC | PRN

## 2022-07-07 MED ORDER — ADULT MULTIVITAMIN W/MINERALS CH
1.0000 | ORAL_TABLET | Freq: Every day | ORAL | Status: DC
  Administered 2022-07-08 – 2022-07-09 (×2): 1 via ORAL
  Filled 2022-07-07 (×4): qty 1

## 2022-07-07 MED ORDER — PROPRANOLOL HCL 10 MG PO TABS
10.0000 mg | ORAL_TABLET | Freq: Two times a day (BID) | ORAL | Status: DC
  Administered 2022-07-07 – 2022-07-09 (×4): 10 mg via ORAL
  Filled 2022-07-07 (×2): qty 14
  Filled 2022-07-07 (×6): qty 1

## 2022-07-07 NOTE — H&P (Addendum)
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 outpatient referrals for mental health follow-up including medication management/psychotherapy  I certify that inpatient services furnished can reasonably be expected to improve the patient's condition.    Starleen Blue, NP 10/11/20234:59 PM

## 2022-07-07 NOTE — Progress Notes (Signed)
The patient attended the evening N.A.meeting and was appropriate.  

## 2022-07-07 NOTE — BHH Group Notes (Signed)
Adult Psychoeducational Group Note  Date:  07/07/2022 Time:  10:58 AM  Group Topic/Focus:  Goals Group:   The focus of this group is to help patients establish daily goals to achieve during treatment and discuss how the patient can incorporate goal setting into their daily lives to aide in recovery.  Participation Level:  Active  Participation Quality:  Appropriate  Affect:  Appropriate  Cognitive:  Appropriate  Insight: Appropriate  Engagement in Group:  Engaged  Modes of Intervention:  Education  Kern Reap 07/07/2022, 10:58 AM

## 2022-07-07 NOTE — BHH Suicide Risk Assessment (Signed)
[place] INPATIENT:  Family/Significant Other Suicide Prevention Education  Suicide Prevention Education:  Education Completed; Hannah Reid [phone] (Wife) has been identified by the patient as the family member/significant other with whom the patient will be residing, and identified as the person(s) who will aid the patient in the event of a mental health crisis (suicidal ideations/suicide attempt).  With written consent from the patient, the family member/significant other has been provided the following suicide prevention education, prior to the and/or following the discharge of the patient.  The suicide prevention education provided includes the following: Suicide risk factors Suicide prevention and interventions National Suicide Hotline telephone number [facility] assessment telephone number [facility] Emergency Assistance [place] and/or Residential Mobile Crisis Unit telephone number  Request made of family/significant other to: Remove weapons (e.g., guns, rifles, knives), all items previously/currently identified as safety concern.   Remove drugs/medications (over-the-counter, prescriptions, illicit drugs), all items previously/currently identified as a safety concern.  The family member/significant other verbalizes understanding of the suicide prevention education information provided.  The family member/significant other agrees to remove the items of safety concern listed above.  CSW spoke with Mrs.  who states that her wife went to [facility] when she ran out of medication.  She states that her wife told her that the provider at [facility] refused to see her and that she was not given her medications at that time.  She states that her wife has been experiencing depression for several years and has not mentioned suicide or made any attempts.  She states taht she also have not seen her wife participate in any self-harming behaviors.  Mrs.   confirms that her wife can return home after discharge and that there are no firearms or weapons in the home.  She states that she feels that her wife's medications have not been working and her mood has been unstable.  She states that she would like her wife's medications adjusted and for her to have a psychiatrist appointment scheduled prior to discharge.  CSW completed SPE with Mrs. Hannah Reid.  Hannah Reid Hannah Reid 07/07/2022, 2:21 PM

## 2022-07-07 NOTE — BHH Group Notes (Signed)
PsychoEducational Group. Patients were given education on the power of positive re-framing. Patients were given interactive exercise in which they were asked to use positive reframing in ordinary situations.  Patients were then read poem of the power of thoughts by the [place] and asked to reflect. Pt attended but was inappropriate, hitting deck of cards on table while leader was speaking and having side conversation.

## 2022-07-07 NOTE — BH IP Treatment Plan (Signed)
Interdisciplinary Treatment and Diagnostic Plan Update  07/07/2022 Time of Session: [place] MRN: [id number]  Principal Diagnosis: MDD (major depressive disorder), recurrent severe, without psychosis ([place])  Secondary Diagnoses: Principal Problem:   MDD (major depressive disorder), recurrent severe, without psychosis ([place])   Current Medications:  Current Facility-Administered Medications  Medication Dose Route Frequency Provider Last Rate Last Admin   acetaminophen (TYLENOL) tablet 650 mg  650 mg Oral Q6H PRN Ntuen, Kris Hartmann, FNP       alum & mag hydroxide-simeth (MAALOX/MYLANTA) 200-200-20 MG/5ML suspension 30 mL  30 mL Oral Q4H PRN Ntuen, Kris Hartmann, FNP       FLUoxetine (PROZAC) capsule 10 mg  10 mg Oral Daily Evette Georges, NP   10 mg at 07/07/22 0813   gabapentin (NEURONTIN) capsule 300 mg  300 mg Oral TID Evette Georges, NP   300 mg at 07/07/22 3559   hydrOXYzine (ATARAX) tablet 50 mg  50 mg Oral Q6H PRN Evette Georges, NP   50 mg at 07/07/22 0811   lisinopril (ZESTRIL) tablet 5 mg  5 mg Oral Daily Evette Georges, NP   5 mg at 07/07/22 7416   magnesium hydroxide (MILK OF MAGNESIA) suspension 30 mL  30 mL Oral Daily PRN Ntuen, Kris Hartmann, FNP       melatonin tablet 10 mg  10 mg Oral QHS Evette Georges, NP   10 mg at 07/06/22 2124   nicotine polacrilex (NICORETTE) gum 2 mg  2 mg Oral PRN Evette Georges, NP   2 mg at 07/07/22 0957   QUEtiapine (SEROQUEL) tablet 600 mg  600 mg Oral QHS Evette Georges, NP   600 mg at 07/06/22 2123   traZODone (DESYREL) tablet 50 mg  50 mg Oral QHS PRN Ntuen, Kris Hartmann, FNP       PTA Medications: Medications Prior to Admission  Medication Sig Dispense Refill Last Dose   FLUoxetine (PROZAC) 10 MG capsule Take 1 capsule (10 mg total) by mouth once daily. 90 capsule 1    gabapentin (NEURONTIN) 300 MG capsule Take 1 capsule (300 mg total) by mouth 3 (three) times daily. 270 capsule 1    hydrOXYzine (ATARAX) 50 MG tablet Take 1 tablet (50 mg total) by mouth every  6 (six) hours as needed. 180 tablet 1    lisinopril (ZESTRIL) 5 MG tablet Take 1 tablet (5 mg total) by mouth once daily. 90 tablet 1    Melatonin 10 MG TBDP Take 10 mg by mouth at bedtime. 30 tablet 1    QUEtiapine (SEROQUEL) 300 MG tablet Take 2 tablets (600 mg total) by mouth once nightly at bedtime. 180 tablet 1     Patient Stressors: Financial difficulties   Health problems   Marital or family conflict   Medication change or noncompliance    Patient Strengths: Ability for insight  Capable of independent living  [employer]ed forces logistics/support/administrative officer  Motivation for treatment/growth   Treatment Modalities: Medication Management, Group therapy, Case management,  1 to 1 session with clinician, Psychoeducation, Recreational therapy.   Physician Treatment Plan for Primary Diagnosis: MDD (major depressive disorder), recurrent severe, without psychosis ([place]) Long Term Goal(s):     Short Term Goals:    Medication Management: Evaluate patient's response, side effects, and tolerance of medication regimen.  Therapeutic Interventions: 1 to 1 sessions, Unit Group sessions and Medication administration.  Evaluation of Outcomes: Progressing  Physician Treatment Plan for Secondary Diagnosis: Principal Problem:   MDD (major depressive disorder), recurrent severe, without psychosis ([place])  Long Term Goal(s):     Short Term Goals:       Medication Management: Evaluate patient's response, side effects, and tolerance of medication regimen.  Therapeutic Interventions: 1 to 1 sessions, Unit Group sessions and Medication administration.  Evaluation of Outcomes: Progressing   RN Treatment Plan for Primary Diagnosis: MDD (major depressive disorder), recurrent severe, without psychosis (HCC) Long Term Goal(s): Knowledge of disease and therapeutic regimen to maintain health will improve  Short Term Goals: Ability to remain free from injury will improve, Ability to verbalize frustration and anger appropriately  will improve, Ability to demonstrate self-control, Ability to participate in decision making will improve, Ability to verbalize feelings will improve, Ability to disclose and discuss suicidal ideas, Ability to identify and develop effective coping behaviors will improve, and Compliance with prescribed medications will improve  Medication Management: RN will administer medications as ordered by provider, will assess and evaluate patient's response and provide education to patient for prescribed medication. RN will report any adverse and/or side effects to prescribing provider.  Therapeutic Interventions: 1 on 1 counseling sessions, Psychoeducation, Medication administration, Evaluate responses to treatment, Monitor vital signs and CBGs as ordered, Perform/monitor CIWA, COWS, AIMS and Fall Risk screenings as ordered, Perform wound care treatments as ordered.  Evaluation of Outcomes: Progressing   LCSW Treatment Plan for Primary Diagnosis: MDD (major depressive disorder), recurrent severe, without psychosis (HCC) Long Term Goal(s): Safe transition to appropriate next level of care at discharge, Engage patient in therapeutic group addressing interpersonal concerns.  Short Term Goals: Engage patient in aftercare planning with referrals and resources, Increase social support, Increase ability to appropriately verbalize feelings, Increase emotional regulation, Facilitate acceptance of mental health diagnosis and concerns, Facilitate patient progression through stages of change regarding substance use diagnoses and concerns, Identify triggers associated with mental health/substance abuse issues, and Increase skills for wellness and recovery  Therapeutic Interventions: Assess for all discharge needs, 1 to 1 time with Social worker, Explore available resources and support systems, Assess for adequacy in community support network, Educate family and significant other(s) on suicide prevention, Complete  Psychosocial Assessment, Interpersonal group therapy.  Evaluation of Outcomes: Progressing   Progress in Treatment: Attending groups: Yes. Participating in groups: Yes. Taking medication as prescribed: Yes. Toleration medication: Yes. Family/Significant other contact made: No, will contact:  CSW will obtain consent to reach family/friend.  Patient understands diagnosis: Yes. Discussing patient identified problems/goals with staff: Yes. Medical problems stabilized or resolved: Yes. Denies suicidal/homicidal ideation: Yes. Issues/concerns per patient self-inventory: Yes. Other: none  New problem(s) identified: No, Describe:  none  New Short Term/Long Term Goal(s): Patient to work towards detox,  medication management for mood stabilization; elimination of SI thoughts; development of comprehensive mental wellness/sobriety plan.  Patient Goals:  Patient states their goal for treatment is to "trying to go home and utilizing supports at home."  Discharge Plan or Barriers: No psychosocial barriers identified at this time, patient to return to place of residence when appropriate for discharge.   Reason for Continuation of Hospitalization: Depression  Estimated Length of Stay: 1-7 days   Last 3 [place] Suicide Severity Risk Score: Flowsheet Row [facility]2022 [facility] from 03/11/2022 in [facility]  C-SSRS RISK CATEGORY No Risk Moderate Risk No Risk       Last PHQ 2/9 Scores:     No data to display  Scribe for Treatment Team: Almedia Balls 07/07/2022 11:14 AM

## 2022-07-07 NOTE — BHH Suicide Risk Assessment (Addendum)
Suicide Risk Assessment  Admission Assessment    [facility] Admission Suicide Risk Assessment   Nursing information obtained from:  Patient Demographic factors:  Low socioeconomic status, Unemployed, Gay, lesbian, or bisexual orientation Current Mental Status:  Self-harm behaviors Loss Factors:  Financial problems / change in socioeconomic status, Decrease in vocational status Historical Factors:  NA Risk Reduction Factors:  Living with another person, especially a relative, Sense of responsibility to family  Total Time spent with patient: 1.5 hours Principal Problem: MDD (major depressive disorder), recurrent severe, without psychosis ([place]) Diagnosis:  Principal Problem:   MDD (major depressive disorder), recurrent severe, without psychosis ([place]) Active Problems:   Essential hypertension   GAD (generalized anxiety disorder)   Severe benzodiazepine use disorder ([place])   Cannabis use disorder   Opioid use disorder  History of Present Illness: Hannah Reid is a 41 yo Caucasian female with prior mental health history of MDD, GAD, and polysubstance abuse who presented to the [facility] with complaints of worsening depressive symptoms and SI with no plan, in the context of the impending death of her mother and other social stressors. Pt was involuntarily committed and transferred to this [facility] for treatment and stabilization of her mood.   Continued Clinical Symptoms: Took 5 Xanax bars which belonged to her mother prior to presenting to [facility]. Denies that this was a suicide attempt and states she was trying to sleep while waiting to be assessed. Seems not to be very forthcoming regarding her current depressive symptoms. Reports that she is still grieving loss of son a year ago, and dealing with the impending death of her mother in 22 months as this is the prognosis that has been given to her d/t skin CA. Tearful, anxious, and presents with a depressed mood, and in  need of continuous hospitalization so that meds can be adjusted and symptoms stabilized.   The "Alcohol Use Disorders Identification Test", Guidelines for Use in Primary Care, Second Edition.  World Pharmacologist [facility]). Score between 0-7:  no or low risk or alcohol related problems. Score between 8-15:  moderate risk of alcohol related problems. Score between 16-19:  high risk of alcohol related problems. Score 20 or above:  warrants further diagnostic evaluation for alcohol dependence and treatment.  CLINICAL FACTORS:   More than one psychiatric diagnosis  Musculoskeletal: Strength & Muscle Tone: within normal limits Gait & Station: normal Patient leans: N/A  Psychiatric Specialty Exam:  Presentation  General Appearance:  Fairly Groomed  Eye Contact: Good  Speech: Clear and Coherent  Speech Volume: Normal  Handedness: Right   Mood and Affect  Mood: Anxious; Depressed  Affect: Congruent   Thought Process  Thought Processes: Coherent  Descriptions of Associations:Intact  Orientation:Full (Time, Place and Person)  Thought Content:Logical  History of Schizophrenia/Schizoaffective disorder:No  Duration of Psychotic Symptoms:N/A  Hallucinations:Hallucinations: None  Ideas of Reference:None  Suicidal Thoughts:Suicidal Thoughts: No  Homicidal Thoughts:Homicidal Thoughts: No   Sensorium  Memory: Immediate Good  Judgment: Poor  Insight: Poor  Executive Functions  Concentration: Fair  Attention Span: Fair  Recall: [place] of Knowledge: Fair  Language: Fair  Psychomotor Activity  Psychomotor Activity: Psychomotor Activity: Normal  Assets  Assets: Communication Skills  Sleep  Sleep: Sleep: Fair  Physical Exam: Physical Exam Constitutional:      Appearance: Normal appearance.  Eyes:     Pupils: Pupils are equal, round, and reactive to light.  Musculoskeletal:        General: Normal  range of motion.   Neurological:     Mental Status: She is oriented to person, place, and time.    Review of Systems  Psychiatric/Behavioral:  Positive for depression and substance abuse. Negative for hallucinations, memory loss and suicidal ideas. The patient is nervous/anxious and has insomnia.    Blood pressure (!) 147/106, pulse 90, temperature 99.3 F (37.4 C), temperature source Oral, resp. rate 16, height 5\' 5"  (1.651 m), weight 108.4 kg, SpO2 99 %. Body mass index is 39.77 kg/m.   COGNITIVE FEATURES THAT CONTRIBUTE TO RISK:  None    SUICIDE RISK:   Minimal: No identifiable suicidal ideation.  Patients presenting with no risk factors but with morbid ruminations; may be classified as minimal risk based on the severity of the depressive symptoms  PLAN OF CARE:   Safety and Monitoring: Voluntary admission to inpatient psychiatric unit for safety, stabilization and treatment Daily contact with patient to assess and evaluate symptoms and progress in treatment Patient's case to be discussed in multi-disciplinary team meeting Observation Level : q15 minute checks Vital signs: q12 hours Precautions: Safety   Long Term Goal(s): Improvement in symptoms so as ready for discharge   Short Term Goals: Ability to identify changes in lifestyle to reduce recurrence of condition will improve, Ability to verbalize feelings will improve, Ability to disclose and discuss suicidal ideas, Ability to demonstrate self-control will improve, Ability to identify and develop effective coping behaviors will improve, Ability to maintain clinical measurements within normal limits will improve, Compliance with prescribed medications will improve, and Ability to identify triggers associated with substance abuse/mental health issues will improve   Diagnoses:  Principal Problem:   MDD (major depressive disorder), recurrent severe, without psychosis (HCC) Active Problems:   Essential hypertension   GAD (generalized anxiety  disorder)   Severe benzodiazepine use disorder (HCC)   Cannabis use disorder   Opioid use disorder   Medications MDD -Change Seroquel from 600 mg QHS to 300 mg QHS (complaints of daytime grogginess on 600 mg ) for mood stabilization -Start Seroquel XR 50 mg daily in the mornings for mood stabilization -Continue Prozac 10 mg daily for depressive symptoms   Anxiety -Continue Hydroxyzine 50 mg every 6 hours PRN -Continue Gabapentin 300 mg TID (home med) -Start Inderal 10 mg BID    Insomnia Continue Melatonin 10 mg nightly  Start Trazodone 50 mg nightly PRN   Nicotine Dependence -Start Nicorette gum 2 mg PRN for nicotine dependence   Other PRNS -Continue Tylenol 650 mg every 6 hours PRN for mild pain -Continue Maalox 30 mg every 4 hrs PRN for indigestion -Continue Milk of Magnesia as needed every 6 hrs for constipation   Discharge Planning: Social work and case management to assist with discharge planning and identification of hospital follow-up needs prior to discharge Estimated LOS: 5-7 days Discharge Concerns: Need to establish a safety plan; Medication compliance and effectiveness Discharge Goals: Return home with outpatient referrals for mental health follow-up including medication management/psychotherapy   I certify that inpatient services furnished can reasonably be expected to improve the patient's condition.   , NP 07/07/2022, 5:11 PM

## 2022-07-07 NOTE — Group Note (Signed)
LCSW Group Therapy Note   Group Date: 07/07/2022 Start Time: 1300 End Time: 1400  Type of Therapy and Topic: Group Therapy: Control  Participation Level: Active  Description of Group: In this group patients will discuss what is out of their control, what is somewhat in their control, and what is within their control.  They will be encouraged to explore what issues they can control and what issues are out of their control within their daily lives. They will be guided to discuss their thoughts, feelings, and behaviors related to these issues. The group will process together ways to better control things that are well within our own control and how to notice and accept the things that are not within our control. This group will be process-oriented, with patients participating in exploration of their own experiences as well as giving and receiving support and challenge from other group members.  During this group 2 worksheets will be provided to each patient to follow along and fill out.   Therapeutic Goals: 1. Patient will identify what is within their control and what is not within their control. 2. Patient will identify their thoughts and feelings about having control over their own lives. 3. Patient will identify their thoughts and feelings about not having control over everything in their lives.. 4. Patient will identify ways that they can have more control over their own lives. 5. Patient will identify areas were they can allow others to help them or provide assistance.  Summary of Patient Progress: The Pt attended group and remained there the entire time. The Pt accepted all worksheets and discussed the topic openly and appropriately with their peers.  The Pt was able to discuss what they have control of or do not have control of, the choices that they can make, and the possible consequences that may arise from their choices.  The Pt was able to identify possible anxiety about these things  and how they can manage that anxiety as well.   Chantay Whitelock M Jaice Lague, LCSWA 07/07/2022  1:57 PM    

## 2022-07-07 NOTE — Plan of Care (Signed)
Nurse discussed anxiety, depression and coping skills with patient.  

## 2022-07-07 NOTE — Progress Notes (Signed)
D:  Patient's self inventory sheet, patient has poor sleep, worried about her mother.  Good appetite, normal energy level, good concentration.  Denied depression and hopeless, just concerned about her mother.  Denied withdrawals.  Feels frustrated.  Denied SI.  Physical problems, pain in hand swelling from falling.  Denied physical pain.  Had been taking med for tooth infection, abscess.  Goal is discharge to be with mother.  Wants to be released.  Mom in ER, decreased food intake, etc.  A:  Medications administered per MD orders.  Emotional support and encouragement given patient. R:  Denied SI and HI, contracts for safety.  Denied A/V hallucinations.  Safety maintained with 15 minute checks.
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
## 2022-07-08 DIAGNOSIS — F332 Major depressive disorder, recurrent severe without psychotic features: Secondary | ICD-10-CM | POA: Diagnosis not present

## 2022-07-08 MED ORDER — CLINDAMYCIN HCL 300 MG PO CAPS
300.0000 mg | ORAL_CAPSULE | Freq: Three times a day (TID) | ORAL | Status: DC
  Administered 2022-07-08 – 2022-07-09 (×4): 300 mg via ORAL
  Filled 2022-07-08 (×4): qty 1
  Filled 2022-07-08: qty 18
  Filled 2022-07-08 (×2): qty 1
  Filled 2022-07-08 (×2): qty 18
  Filled 2022-07-08: qty 1

## 2022-07-08 MED ORDER — BENZOCAINE 10 % MT GEL: Freq: Three times a day (TID) | OROMUCOSAL | Status: DC | PRN

## 2022-07-08 MED ORDER — DIPHENHYDRAMINE HCL 25 MG PO CAPS: 50.0000 mg | ORAL_CAPSULE | Freq: Four times a day (QID) | ORAL | Status: DC | PRN

## 2022-07-08 NOTE — Progress Notes (Signed)
Pt states she has recurrent severe tooth pain. Pt denied regular dental follow ups and stated she was being treated with antibiotics for tooth infection but never completed the course. Pt stated she made the provider aware to reorder the antibiotics. Face is symmetrical, dental carries visible.     07/07/22 2225  Pain Assessment  Pain Scale 0-10  Pain Score 9  Pain Type Chronic pain  Pain Location Teeth

## 2022-07-08 NOTE — Progress Notes (Signed)
[facility] MD Progress Note  07/08/2022 2:18 PM Hannah Reid  MRN:  [id number] Principal Problem: MDD (major depressive disorder), recurrent severe, without psychosis (HCC) Diagnosis: Principal Problem:   MDD (major depressive disorder), recurrent severe, without psychosis (HCC) Active Problems:   Essential hypertension   GAD (generalized anxiety disorder)   Severe benzodiazepine use disorder (HCC)   Cannabis use disorder   Opioid use disorder  History of Present Illness: Hannah Reid is a 41 yo Caucasian female with prior mental health history of MDD, GAD, and polysubstance abuse who presented to the [facility] with complaints of worsening depressive symptoms and SI with no plan, in the context of the impending death of her mother and other social stressors. Pt was involuntarily committed and transferred to this [facility] for treatment and stabilization of her mood.   24 hour chart review: Vital signs over the past 24 hours have been within normal limits.  Patient is compliant with all of her scheduled medications.  She has required the following as needed medications in the last 24 hours; Tylenol 650 mg earlier today morning, hydroxyzine 50 mg for anxiety earlier today morning, naproxen 500 mg for pain earlier today morning, Nicorette gum earlier today morning.  Patient has been visible in the milieu attending group sessions and interacting with peers.  Today's patient assessment note: Patient's mood today is anxious and affect is congruent.  She is circumstantial and ruminates about wanting to be discharged and about the events leading to her presentation at the [facility].  Patient denies SI, denies HI, denies auditory or visual hallucinations, denies paranoia, and there is no evidence of delusional thinking.  Patient reports a poor sleep quality last night but states that is because of tooth pain which rendered her unable to sleep.  She reports that she was  on antibiotics for a tooth abscess prior to hospitalization even though she is unable to recall which medication she was on.  She rates her pain tooth today as a 9 open (10 being the worst).  She reports her appetite is poor but states that it is related to not being able to eat secondary to tooth pain.  She reports a rash to her bilateral arms, denies any tongue swelling denies any anaphylactic type reactions.  We are adding the following medications the patient's medication regimen: Clindamycin 300 mg every 8 hours for tooth abscess, Orajel as needed for tooth pain, and Benadryl 50 mg as needed every 6 hours for rash/allergic reactions.  We are continuing other medications as listed below.  Past Psychiatric History: As above  Past Medical History:  Past Medical History:  Diagnosis Date   Hypertension     Past Surgical History:  Procedure Laterality Date   ABDOMINAL HYSTERECTOMY     CHOLECYSTECTOMY     kidney tumor removal     Family History:  Family History  Problem Relation Age of Onset   Hypertension Mother    Cancer Mother        Breast Cancer   Hypertension Father    Family Psychiatric  History: States mental illness runs in family, but unable to specify which one. Social History:  Social History   Substance and Sexual Activity  Alcohol Use Not Currently   Comment: socially     Social History   Substance and Sexual Activity  Drug Use Not Currently   Types: Marijuana, Benzodiazepines   Comment: 5 years with no use    Social History  Socioeconomic History   Marital status: Single    Spouse name: Not on file   Number of children: Not on file   Years of education: Not on file   Highest education level: Not on file  Occupational History   Not on file  Tobacco Use   Smoking status: Every Day    Packs/day: 0.50    Years: 25.00    Total pack years: 12.50    Types: Cigarettes   Smokeless tobacco: Never  Vaping Use   Vaping Use: Every day   Substances:  Flavoring  Substance and Sexual Activity   Alcohol use: Not Currently    Comment: socially   Drug use: Not Currently    Types: Marijuana, Benzodiazepines    Comment: 5 years with no use   Sexual activity: Yes  Other Topics Concern   Not on file  Social History Narrative   Not on file   Social Determinants of Health   Financial Resource Strain: Not on file  Food Insecurity: No Food Insecurity (07/06/2022)   Hunger Vital Sign    Worried About Running Out of Food in the Last Year: Never true    Ran Out of Food in the Last Year: Never true  Transportation Needs: No Transportation Needs (07/06/2022)   PRAPARE - Hydrologist (Medical): No    Lack of Transportation (Non-Medical): No  Physical Activity: Not on file  Stress: Not on file  Social Connections: Not on file    Additional Social History: Pt reports that she resides with her mother, her wife, and 2 children ages 40 and 101.  She reports that she currently does not work and is dependent on her wife's disability income. Sleep: Poor  Appetite:  Poor  Current Medications: Current Facility-Administered Medications  Medication Dose Route Frequency Provider Last Rate Last Admin   acetaminophen (TYLENOL) tablet 650 mg  650 mg Oral Q6H PRN Ntuen, Kris Hartmann, FNP   650 mg at 07/08/22 1105   alum & mag hydroxide-simeth (MAALOX/MYLANTA) 200-200-20 MG/5ML suspension 30 mL  30 mL Oral Q4H PRN Ntuen, Kris Hartmann, FNP       benzocaine (ORAJEL) 10 % mucosal gel   Mouth/Throat TID PRN Massengill, Ovid Curd, MD       clindamycin (CLEOCIN) capsule 300 mg  300 mg Oral Q8H Massengill, Nathan, MD   300 mg at 07/08/22 1101   cloNIDine (CATAPRES) tablet 0.1 mg  0.1 mg Oral Q4H PRN Massengill, Ovid Curd, MD       dicyclomine (BENTYL) tablet 20 mg  20 mg Oral Q6H PRN Massengill, Nathan, MD       diphenhydrAMINE (BENADRYL) capsule 50 mg  50 mg Oral Q6H PRN Khalani Novoa, NP       FLUoxetine (PROZAC) capsule 10 mg  10 mg Oral Daily  Evette Georges, NP   10 mg at 07/08/22 0757   gabapentin (NEURONTIN) capsule 300 mg  300 mg Oral TID Evette Georges, NP   300 mg at 07/08/22 1254   hydrOXYzine (ATARAX) tablet 50 mg  50 mg Oral Q6H PRN Evette Georges, NP   50 mg at 07/08/22 0757   lisinopril (ZESTRIL) tablet 10 mg  10 mg Oral Daily Massengill, Ovid Curd, MD   10 mg at 07/08/22 0758   loperamide (IMODIUM) capsule 2-4 mg  2-4 mg Oral PRN Massengill, Ovid Curd, MD       LORazepam (ATIVAN) tablet 1 mg  1 mg Oral Q6H PRN Janine Limbo, MD  magnesium hydroxide (MILK OF MAGNESIA) suspension 30 mL  30 mL Oral Daily PRN Ntuen, Jesusita Oka, FNP       melatonin tablet 10 mg  10 mg Oral QHS Sindy Guadeloupe, NP   10 mg at 07/07/22 2133   methocarbamol (ROBAXIN) tablet 500 mg  500 mg Oral Q8H PRN Massengill, Harrold Donath, MD       multivitamin with minerals tablet 1 tablet  1 tablet Oral Daily Massengill, Harrold Donath, MD   1 tablet at 07/08/22 0757   naproxen (NAPROSYN) tablet 500 mg  500 mg Oral BID PRN Phineas Inches, MD   500 mg at 07/08/22 0801   nicotine polacrilex (NICORETTE) gum 2 mg  2 mg Oral PRN Sindy Guadeloupe, NP   2 mg at 07/08/22 1057   ondansetron (ZOFRAN-ODT) disintegrating tablet 4 mg  4 mg Oral Q6H PRN Massengill, Harrold Donath, MD       propranolol (INDERAL) tablet 10 mg  10 mg Oral BID Phineas Inches, MD   10 mg at 07/08/22 0758   QUEtiapine (SEROQUEL XR) 24 hr tablet 50 mg  50 mg Oral Daily Massengill, Harrold Donath, MD   50 mg at 07/08/22 0758   QUEtiapine (SEROQUEL) tablet 300 mg  300 mg Oral QHS Massengill, Harrold Donath, MD   300 mg at 07/07/22 2133   traZODone (DESYREL) tablet 50 mg  50 mg Oral QHS PRN Cecilie Lowers, FNP       Lab Results:  Results for orders placed or performed during the hospital encounter of 07/06/22 (from the past 48 hour(s))  Urinalysis, Complete w Microscopic Urine, Random     Status: None   Collection Time: 07/06/22  4:39 PM  Result Value Ref Range   Color, Urine YELLOW YELLOW   APPearance CLEAR CLEAR   Specific Gravity,  Urine 1.018 1.005 - 1.030   pH 5.0 5.0 - 8.0   Glucose, UA NEGATIVE NEGATIVE mg/dL   Hgb urine dipstick NEGATIVE NEGATIVE   Bilirubin Urine NEGATIVE NEGATIVE   Ketones, ur NEGATIVE NEGATIVE mg/dL   Protein, ur NEGATIVE NEGATIVE mg/dL   Nitrite NEGATIVE NEGATIVE   Leukocytes,Ua NEGATIVE NEGATIVE   RBC / HPF 0-5 0 - 5 RBC/hpf   WBC, UA 0-5 0 - 5 WBC/hpf   Bacteria, UA NONE SEEN NONE SEEN   Squamous Epithelial / LPF 0-5 0 - 5   Mucus PRESENT     Comment: Performed at [facility], [address], [place], [place] [address]  Pregnancy, urine     Status: None   Collection Time: 07/06/22  4:39 PM  Result Value Ref Range   Preg Test, Ur NEGATIVE NEGATIVE    Comment:        THE SENSITIVITY OF THIS METHODOLOGY IS >20 mIU/mL. Performed at [facility], [address] [place], [place] [address]   Rapid urine drug screen (hospital performed) not at [facility]     Status: Abnormal   Collection Time: 07/06/22  4:39 PM  Result Value Ref Range   Opiates NONE DETECTED NONE DETECTED   Cocaine NONE DETECTED NONE DETECTED   Benzodiazepines POSITIVE (A) NONE DETECTED   Amphetamines NONE DETECTED NONE DETECTED   Tetrahydrocannabinol POSITIVE (A) NONE DETECTED   Barbiturates NONE DETECTED NONE DETECTED    Comment: (NOTE) DRUG SCREEN FOR MEDICAL PURPOSES ONLY.  IF CONFIRMATION IS NEEDED FOR ANY PURPOSE, NOTIFY LAB WITHIN 5 DAYS.  LOWEST DETECTABLE LIMITS FOR URINE DRUG SCREEN Drug Class  Cutoff (ng/mL) Amphetamine and metabolites    1000 Barbiturate and metabolites    200 Benzodiazepine                 200 Opiates and metabolites        300 Cocaine and metabolites        300 THC                            50 Performed at [facility], [address], [place], [place] [address]   CBC     Status: Abnormal   Collection Time: 07/07/22  7:34 AM  Result Value Ref Range   WBC 7.0 4.0 - 10.5 K/uL   RBC 5.26 (H) 3.87 - 5.11  MIL/uL   Hemoglobin 14.5 12.0 - 15.0 g/dL   HCT 60.4 54.0 - 98.1 %   MCV 82.7 80.0 - 100.0 fL   MCH 27.6 26.0 - 34.0 pg   MCHC 33.3 30.0 - 36.0 g/dL   RDW 19.1 47.8 - 29.5 %   Platelets 305 150 - 400 K/uL   nRBC 0.0 0.0 - 0.2 %    Comment: Performed at [facility], [address], [place], [place] [address]  Comprehensive metabolic panel     Status: Abnormal   Collection Time: 07/07/22  7:34 AM  Result Value Ref Range   Sodium 137 135 - 145 mmol/L   Potassium 3.6 3.5 - 5.1 mmol/L   Chloride 105 98 - 111 mmol/L   CO2 25 22 - 32 mmol/L   Glucose, Bld 112 (H) 70 - 99 mg/dL    Comment: Glucose reference range applies only to samples taken after fasting for at least 8 hours.   BUN 22 (H) 6 - 20 mg/dL   Creatinine, Ser 8.65 (H) 0.44 - 1.00 mg/dL   Calcium 9.6 8.9 - 78.4 mg/dL   Total Protein 7.4 6.5 - 8.1 g/dL   Albumin 4.1 3.5 - 5.0 g/dL   AST 33 15 - 41 U/L   ALT 63 (H) 0 - 44 U/L   Alkaline Phosphatase 74 38 - 126 U/L   Total Bilirubin 0.5 0.3 - 1.2 mg/dL   GFR, Estimated >69 >62 mL/min    Comment: (NOTE) Calculated using the CKD-EPI Creatinine Equation (2021)    Anion gap 7 5 - 15    Comment: Performed at [facility], [address] [place], [place] [address]  Hemoglobin A1c     Status: None   Collection Time: 07/07/22  7:34 AM  Result Value Ref Range   Hgb A1c MFr Bld 5.1 4.8 - 5.6 %    Comment: (NOTE) Pre diabetes:          5.7%-6.4%  Diabetes:              >6.4%  Glycemic control for   <7.0% adults with diabetes    Mean Plasma Glucose 99.67 mg/dL    Comment: Performed at [facility], [address], [place], [place] [address]  Lipid panel     Status: Abnormal   Collection Time: 07/07/22  7:34 AM  Result Value Ref Range   Cholesterol 312 (H) 0 - 200 mg/dL   Triglycerides 010 (H) <150 mg/dL   HDL 45 >27 mg/dL   Total CHOL/HDL Ratio 6.9 RATIO   VLDL 66 (H) 0 - 40 mg/dL   LDL Cholesterol 253 (H) 0 - 99 mg/dL  Comment:        Total Cholesterol/HDL:CHD Risk Coronary Heart Disease Risk Table                     Men   Women  1/2 Average Risk   3.4   3.3  Average Risk       5.0   4.4  2 X Average Risk   9.6   7.1  3 X Average Risk  23.4   11.0        Use the calculated Patient Ratio above and the CHD Risk Table to determine the patient's CHD Risk.        ATP III CLASSIFICATION (LDL):  <100     mg/dL   Optimal  818-563  mg/dL   Near or Above                    Optimal  130-159  mg/dL   Borderline  149-702  mg/dL   High  >637     mg/dL   Very High Performed at [facility], [address] [place], [place] [address]   TSH     Status: None   Collection Time: 07/07/22  7:34 AM  Result Value Ref Range   TSH 3.965 0.350 - 4.500 uIU/mL    Comment: Performed by a 3rd Generation assay with a functional sensitivity of <=0.01 uIU/mL. Performed at [facility], [address], [place], [place] [address]    Blood Alcohol level:  Lab Results  Component Value Date   ETH <10 07/03/2022   ETH <10 12/22/2021   Metabolic Disorder Labs: Lab Results  Component Value Date   HGBA1C 5.1 07/07/2022   MPG 99.67 07/07/2022   MPG 100 10/19/2021   No results found for: "PROLACTIN" Lab Results  Component Value Date   CHOL 312 (H) 07/07/2022   TRIG 330 (H) 07/07/2022   HDL 45 07/07/2022   CHOLHDL 6.9 07/07/2022   VLDL 66 (H) 07/07/2022   LDLCALC 201 (H) 07/07/2022   LDLCALC 170 (H) 10/19/2021   Physical Findings: AIMS: Facial and Oral Movements Muscles of Facial Expression: None, normal Lips and Perioral Area: None, normal Jaw: None, normal Tongue: None, normal,Extremity Movements Upper (arms, wrists, hands, fingers): None, normal Lower (legs, knees, ankles, toes): None, normal, Trunk Movements Neck, shoulders, hips: None, normal, Overall Severity Severity of abnormal movements (highest score from questions above): None, normal Incapacitation due to  abnormal movements: None, normal Patient's awareness of abnormal movements (rate only patient's report): No Awareness, Dental Status Current problems with teeth and/or dentures?: No Does patient usually wear dentures?: No  CIWA:  CIWA-Ar Total: 0 COWS:  COWS Total Score: 2  Musculoskeletal: Strength & Muscle Tone: within normal limits Gait & Station: normal Patient leans: N/A  Psychiatric Specialty Exam:  Presentation  General Appearance:  Appropriate for Environment; Fairly Groomed  Eye Contact: Good  Speech: Clear and Coherent  Speech Volume: Normal  Handedness: Right  Mood and Affect  Mood: Anxious  Affect: Congruent  Thought Process  Thought Processes: Coherent  Descriptions of Associations:Intact  Orientation:Full (Time, Place and Person)  Thought Content:Logical  History of Schizophrenia/Schizoaffective disorder:No  Duration of Psychotic Symptoms:N/A  Hallucinations:Hallucinations: None  Ideas of Reference:None  Suicidal Thoughts:Suicidal Thoughts: No  Homicidal Thoughts:Homicidal Thoughts: No  Sensorium  Memory: Immediate Good  Judgment: Fair  Insight: Fair  Art therapist  Concentration: Fair  Attention Span: Good  Recall: Good  Fund of Knowledge: Good  Language: Fair  Psychomotor Activity  Psychomotor Activity: Psychomotor Activity: Normal  Assets  Assets: Resilience  Sleep  Sleep: Sleep: Poor  Physical Exam: Physical Exam Constitutional:      Appearance: Normal appearance.  HENT:     Head: Normocephalic.     Nose: Nose normal. No congestion or rhinorrhea.  Eyes:     Pupils: Pupils are equal, round, and reactive to light.  Pulmonary:     Effort: Pulmonary effort is normal.  Musculoskeletal:     Cervical back: Normal range of motion.  Neurological:     Mental Status: She is alert and oriented to person, place, and time.  Psychiatric:        Behavior: Behavior normal.    Review of Systems   Constitutional: Negative.  Negative for fever.  HENT: Negative.    Eyes: Negative.   Respiratory: Negative.    Cardiovascular: Negative.   Gastrointestinal: Negative.   Genitourinary: Negative.   Musculoskeletal: Negative.   Skin: Negative.   Neurological: Negative.   Psychiatric/Behavioral:  Positive for depression and substance abuse. Negative for hallucinations, memory loss and suicidal ideas. The patient is nervous/anxious and has insomnia.    Blood pressure 121/89, pulse 70, temperature 98.1 F (36.7 C), temperature source Oral, resp. rate 16, height 5\' 5"  (1.651 m), weight 108.4 kg, SpO2 100 %. Body mass index is 39.77 kg/m.  Treatment Plan Summary: Daily contact with patient to assess and evaluate symptoms and progress in treatment and Medication management   Observation Level/Precautions:  15 minute checks  Laboratory:  Labs reviewed   Psychotherapy:  Unit Group sessions  [facility]  [facility]  Consultations:  To be determined   Discharge Concerns:  Safety, medication compliance, mood stability  Estimated LOS: 5-7 days  Other:  N/A    Labs independently reviewed on 07/08/2022: Lipid panel with cholesterol of 312, Tryglycerides 330, LDL-201, educated on healthy food choices and exercise and will need PCP f/u after discharge. Hemoglobin A1C and TSH WNL. CBC and CMP reviewed. UA with trace blood otherwise, WNL. Baseline EKG NSR with QTC-452.   PLAN Safety and Monitoring: Voluntary admission to inpatient psychiatric unit for safety, stabilization and treatment Daily contact with patient to assess and evaluate symptoms and progress in treatment Patient's case to be discussed in multi-disciplinary team meeting Observation Level : q15 minute checks Vital signs: q12 hours Precautions: Safety   Long Term Goal(s): Improvement in symptoms so as ready for discharge   Short Term Goals: Ability to identify changes in lifestyle to reduce recurrence of condition will improve,  Ability to verbalize feelings will improve, Ability to disclose and discuss suicidal ideas, Ability to demonstrate self-control will improve, Ability to identify and develop effective coping behaviors will improve, Ability to maintain clinical measurements within normal limits will improve, Compliance with prescribed medications will improve, and Ability to identify triggers associated with substance abuse/mental health issues will improve   Diagnoses:  Principal Problem:   MDD (major depressive disorder), recurrent severe, without psychosis (HCC) Active Problems:   Essential hypertension   GAD (generalized anxiety disorder)   Severe benzodiazepine use disorder (HCC)   Cannabis use disorder   Opioid use disorder   Medications MDD -Continue Seroquel 300 mg QHS  for mood stabilization -Continue Seroquel XR 50 mg daily in the mornings for mood stabilization -Continue Prozac 10 mg daily for depressive symptoms   Anxiety -Continue Hydroxyzine 50 mg every 6 hours PRN -Continue Gabapentin 300 mg TID (home med) -Continue Inderal 10 mg BID  Insomnia Continue Melatonin 10 mg nightly  Continue Trazodone 50 mg nightly PRN  Tooth abscess -Start Clindamycin 300 mg Q 8 H -Start Orajel PRN   Nicotine Dependence -Continue Nicorette gum 2 mg PRN for nicotine dependence   Other PRNS -Start Benadryl 50 mg Q6H PRN for allergic reactions -Continue Tylenol 650 mg every 6 hours PRN for mild pain -Continue Maalox 30 mg every 4 hrs PRN for indigestion -Continue Milk of Magnesia as needed every 6 hrs for constipation   Discharge Planning: Social work and case management to assist with discharge planning and identification of hospital follow-up needs prior to discharge Estimated LOS: 5-7 days Discharge Concerns: Need to establish a safety plan; Medication compliance and effectiveness Discharge Goals: Return home with outpatient referrals for mental health follow-up including medication  management/psychotherapy    Starleen Blue, NP 07/08/2022, 2:18 PM

## 2022-07-08 NOTE — Progress Notes (Signed)
The patient rated her day as an 8 out of 10 since she feels that her medications have been ordered correctly. She states that her blood pressure is okay and is feeling less depressed. Her coping skill is to listen to music and play cards.

## 2022-07-08 NOTE — Progress Notes (Signed)
D. Pt presented with a anxious affect/ mood, calm, cooperative behavior, per pt's self inventory,  pt rated her depression,hopelessness and anxiety all 0's today, and reported that her goal was "to leave". Pt has been visible in the milieu throughout the shift, observed interacting well with her peers. -  Pt currently denies SI/HI and AVH  A. Labs and vitals monitored. Pt given and educated on medications. Pt given prn meds for ongoing tooth pain. Pt supported emotionally and encouraged to express concerns and ask questions.   R. Pt remains safe with 15 minute checks. Will continue POC.

## 2022-07-09 DIAGNOSIS — F332 Major depressive disorder, recurrent severe without psychotic features: Secondary | ICD-10-CM | POA: Diagnosis not present

## 2022-07-09 MED ORDER — FLUOXETINE HCL 10 MG PO CAPS
10.0000 mg | ORAL_CAPSULE | Freq: Every day | ORAL | 0 refills | Status: DC
  Filled 2022-07-15: qty 30, 30d supply, fill #0

## 2022-07-09 MED ORDER — NICOTINE POLACRILEX 2 MG MT GUM: 2.0000 mg | CHEWING_GUM | OROMUCOSAL | 0 refills | Status: DC | PRN

## 2022-07-09 MED ORDER — QUETIAPINE FUMARATE 300 MG PO TABS
300.0000 mg | ORAL_TABLET | Freq: Every day | ORAL | 0 refills | Status: AC
  Filled 2022-07-15: qty 30, 30d supply, fill #0

## 2022-07-09 MED ORDER — BENZOCAINE 10 % MT GEL: Freq: Three times a day (TID) | OROMUCOSAL | 0 refills | Status: DC | PRN

## 2022-07-09 MED ORDER — HYDROXYZINE HCL 50 MG PO TABS
50.0000 mg | ORAL_TABLET | Freq: Four times a day (QID) | ORAL | 0 refills | Status: AC | PRN
  Filled 2022-07-15: qty 30, 8d supply, fill #0

## 2022-07-09 MED ORDER — LISINOPRIL 5 MG PO TABS
5.0000 mg | ORAL_TABLET | Freq: Every day | ORAL | Status: DC
  Filled 2022-07-09 (×2): qty 7

## 2022-07-09 MED ORDER — CLINDAMYCIN HCL 300 MG PO CAPS: 300.0000 mg | ORAL_CAPSULE | Freq: Three times a day (TID) | ORAL | 0 refills | Status: AC

## 2022-07-09 MED ORDER — QUETIAPINE FUMARATE ER 50 MG PO TB24
50.0000 mg | ORAL_TABLET | Freq: Every day | ORAL | 0 refills | Status: DC
  Filled 2022-07-15: qty 30, 30d supply, fill #0

## 2022-07-09 MED ORDER — GABAPENTIN 300 MG PO CAPS
300.0000 mg | ORAL_CAPSULE | Freq: Three times a day (TID) | ORAL | 0 refills | Status: DC
  Filled 2022-07-15: qty 90, 30d supply, fill #0

## 2022-07-09 MED ORDER — PROPRANOLOL HCL 10 MG PO TABS: 10.0000 mg | ORAL_TABLET | Freq: Two times a day (BID) | ORAL | 0 refills | Status: DC

## 2022-07-09 NOTE — Progress Notes (Signed)
Pt discharged to lobby. Pt was stable and appreciative at that time. All papers,samples, suicide safety plan form and prescriptions were given and valuables returned. Verbal understanding expressed. Denies SI/HI and A/VH. Pt given opportunity to express concerns and ask questions.

## 2022-07-09 NOTE — [facility] Suicide Risk Assessment (Cosign Needed Addendum)
Suicide Risk Assessment  Discharge Assessment    [facility] Discharge Suicide Risk Assessment   Principal Problem: MDD (major depressive disorder), recurrent severe, without psychosis ([place]) Discharge Diagnoses: Principal Problem:   MDD (major depressive disorder), recurrent severe, without psychosis ([place]) Active Problems:   Essential hypertension   GAD (generalized anxiety disorder)   Severe benzodiazepine use disorder (HCC)   Cannabis use disorder   Opioid use disorder  HPI:  Hannah Reid is a 41 yo Caucasian female with prior mental health history of MDD, GAD, and polysubstance abuse who presented to the [place] center with complaints of worsening depressive symptoms and SI with no plan, in the context of the impending death of her mother and other social stressors. Pt was involuntarily committed and transferred to this [facility] for treatment and stabilization of her mood. On assessment at time of admission, patient reported that she had taken "5 bars of Xanax" which belonged to her mother prior to presenting to [facility]. She reported that she had gone to [place] for medication refills because she does not have an outpatient mental health provider, and ran out of Hydroxyzine, and was about to run out of Seroquel. She denied that she was ever feeling suicidal prior to going to [facility]. She reported that she might have made some statements which she does not recall, in the context of Benzo intoxication. She repeatedly asked to be discharged after presenting to this [facility].                                        HOSPITAL COURSE During the patient's hospitalization, patient had extensive initial psychiatric evaluation, and follow-up psychiatric evaluations every day. Psychiatric diagnoses provided upon initial assessment were as follows:  Principal Problem:   MDD (major depressive disorder), recurrent severe, without psychosis ([place]) Active Problems:    Essential hypertension   GAD (generalized anxiety disorder)   Severe benzodiazepine use disorder ([place])   Cannabis use disorder   Opioid use disorder  Patient's psychiatric medications were adjusted on admission as follows: MDD -Changed Seroquel from 600 mg QHS to 300 mg QHS (complaints of daytime grogginess on 600 mg-this is her home medication ) for mood stabilization -Started Seroquel XR 50 mg daily in the mornings for mood stabilization -Continued Prozac 10 mg daily for depressive symptoms (home med)   Anxiety -Continue Hydroxyzine 50 mg every 6 hours PRN (home med) -Continue Gabapentin 300 mg TID (home med) -Started Inderal 10 mg BID    Insomnia Continue Melatonin 10 mg nightly (home med) Started Trazodone 50 mg nightly PRN   Nicotine Dependence -Started Nicorette gum 2 mg PRN for nicotine dependence  During the hospitalization, other adjustments were made to the patient's psychiatric medication regimen, with medications at discharge being as follows: MDD -Continue Seroquel 300 mg QHS  for mood stabilization -Continue Seroquel XR 50 mg daily in the mornings for mood stabilization -Continue Prozac 10 mg daily for depressive symptoms   Anxiety -Continue Hydroxyzine 50 mg every 6 hours PRN -Continue Gabapentin 300 mg TID (home med) -Continue Inderal 10 mg BID    Insomnia Continue Melatonin 10 mg nightly  Continue Trazodone 50 mg nightly PRN   Gingivitis -Continue Clindamycin 300 mg Q 8 H x 6 days, then stop -Continue Orajel PRN   Nicotine Dependence -Continue Nicorette gum 2 mg PRN for nicotine dependence  Patient's care was discussed  during the interdisciplinary team meeting every day during the hospitalization. The patient denies having side effects to prescribed psychiatric medication.The patient was evaluated each day by a clinical provider to ascertain response to treatment. Improvement was noted by the patient's report of decreasing symptoms, improved sleep and  appetite, affect, medication tolerance, behavior, and participation in unit programming.  Patient was asked each day to complete a self inventory noting mood, mental status, pain, new symptoms, anxiety and concerns.    Symptoms were reported as significantly decreased or resolved completely by discharge.   On day of discharge, the patient reports that their mood is stable. The patient denied having suicidal thoughts for more than 48 hours prior to discharge.  Patient denies having homicidal thoughts.  Patient denies having auditory hallucinations.  Patient denies any visual hallucinations or other symptoms of psychosis. The patient was motivated to continue taking medication with a goal of continued improvement in mental health.   The patient reports their target psychiatric symptoms of depression, anxiety and insomnia responded well to the psychiatric medications, and the patient reports overall benefit other psychiatric hospitalization. Supportive psychotherapy was provided to the patient. The patient also participated in regular group therapy while hospitalized. Coping skills, problem solving as well as relaxation therapies were also part of the unit programming.  Labs reviewed with patient and abnormal results were discussed with the patient.: Lipid panel with cholesterol of 312, Tryglycerides 330, LDL-201, educated on healthy food choices and exercise and will need PCP f/u after discharge. Hemoglobin A1C and TSH WNL. CBC and CMP reviewed. UA with trace blood otherwise, WNL. Baseline EKG with Qtc 452.  Total Time spent with patient: 30 minutes  Musculoskeletal: Strength & Muscle Tone: within normal limits Gait & Station: normal Patient leans: N/A  Psychiatric Specialty Exam  Presentation  General Appearance:  Appropriate for Environment; Fairly Groomed  Eye Contact: Fair  Speech: Clear and Coherent  Speech Volume: Normal  Handedness: Right  Mood and Affect   Mood: Euthymic  Duration of Depression Symptoms: Greater than two weeks  Affect: Appropriate; Congruent  Thought Process  Thought Processes: Coherent  Descriptions of Associations:Intact  Orientation:Full (Time, Place and Person)  Thought Content:Logical  History of Schizophrenia/Schizoaffective disorder:No  Duration of Psychotic Symptoms:N/A  Hallucinations:Hallucinations: None  Ideas of Reference:None  Suicidal Thoughts:Suicidal Thoughts: No  Homicidal Thoughts:Homicidal Thoughts: No  Sensorium  Memory: Immediate Fair  Judgment: Good  Insight: Good  Executive Functions  Concentration: Good  Attention Span: Good  Recall: Good  Fund of Knowledge: Good  Language: Good  Psychomotor Activity  Psychomotor Activity: Psychomotor Activity: Normal  Assets  Assets: Communication Skills  Sleep  Sleep: Sleep: Good  Physical Exam: Physical Exam Constitutional:      Appearance: Normal appearance.  HENT:     Head: Normocephalic.     Nose: Nose normal. No congestion.  Eyes:     Pupils: Pupils are equal, round, and reactive to light.  Musculoskeletal:        General: Normal range of motion.     Cervical back: Normal range of motion.  Neurological:     Mental Status: She is alert and oriented to person, place, and time.  Psychiatric:        Behavior: Behavior normal.        Thought Content: Thought content normal.    Review of Systems  Constitutional: Negative.   HENT: Negative.    Eyes: Negative.   Respiratory: Negative.    Cardiovascular: Negative.   Gastrointestinal: Negative.  Negative for heartburn.  Genitourinary: Negative.   Musculoskeletal: Negative.   Skin: Negative.   Neurological: Negative.   Psychiatric/Behavioral:  Positive for depression (Denies SI/HI/AVH and verbally contracts for safety outside of this [facility]) and substance abuse (Educated on cessation). Negative for hallucinations, memory loss and suicidal  ideas. The patient is nervous/anxious (Resolving on current meds) and has insomnia (resolving on current meds).    Blood pressure (!) 113/94, pulse 91, temperature 98.3 F (36.8 C), temperature source Oral, resp. rate 16, height 5\' 5"  (1.651 m), weight 108.4 kg, SpO2 100 %. Body mass index is 39.77 kg/m.  Mental Status Per Nursing Assessment::   On Admission:  Self-harm behaviors  Demographic Factors:  Caucasian and Low socioeconomic status  Loss Factors: Financial problems/change in socioeconomic status  Historical Factors: Impulsivity  Risk Reduction Factors:   Sense of responsibility to family and Living with another person, especially a relative  Continued Clinical Symptoms:  Patient reports that her depressive symptoms have resolved, she denies SI, denies HI, denies AVH, denies paranoia, verbally contracts for safety outside of this [facility].  Cognitive Features That Contribute To Risk:  None    Suicide Risk:  Mild:  There are no identifiable suicide plans, no associated intent, mild dysphoria and related symptoms, good self-control (both objective and subjective assessment), few other risk factors, and identifiable protective factors, including available and accessible social support.   Follow-up Information     [facility] Follow up on 07/16/2022.   Why: You are scheduled for an intake appointment for therapy and medication management on 07/16/2022 at 10:00am.  This appointment will be held in-person but you may change it to virtual by calling the agency directly. Contact information: [place] [phone]                Plan Of Care/Follow-up recommendations:  The patient is able to verbalize their individual safety plan to this provider.  # It is recommended to the patient to continue psychiatric medications as prescribed, after discharge from the hospital.    # It is recommended to the patient to follow up  with your outpatient psychiatric provider and PCP.  # It was discussed with the patient, the impact of alcohol, drugs, tobacco have been there overall psychiatric and medical wellbeing, and total abstinence from substance use was recommended the patient.ed.  # Prescriptions provided or sent directly to preferred pharmacy at discharge. Patient agreeable to plan. Given opportunity to ask questions. Appears to feel comfortable with discharge.    # In the event of worsening symptoms, the patient is instructed to call the crisis hotline (988), 911 and or go to the nearest ED for appropriate evaluation and treatment of symptoms. To follow-up with primary care provider for other medical issues, concerns and or health care needs  # Patient was discharged home with a plan to follow up as noted above.   Nicholes Rough, NP 07/09/2022, 10:24 AM

## 2022-07-09 NOTE — Progress Notes (Signed)
  [facility] Adult Case Management Discharge Plan :  Will you be returning to the same living situation after discharge:  Yes,  Home with Wife  At discharge, do you have transportation home?: Yes,  Taxi Do you have the ability to pay for your medications: Yes,  Community Support  Release of information consent forms completed and in the chart;  Patient's signature needed at discharge.  Patient to Follow up at:  Follow-up Information     [place] [facility] Follow up on 07/16/2022.   Why: You are scheduled for an intake appointment for therapy and medication management on 07/16/2022 at 10:00am.  This appointment will be held in-person but you may change it to virtual by calling the agency directly. Contact information: [place] [id number] [phone]                 Next level of care provider has access to [place] and Suicide Prevention discussed: Yes,  with patient and wife      Has patient been referred to the Quitline?: Patient refused referral  Patient has been referred for addiction treatment: N/A  Darleen Crocker, [place] 07/09/2022, 9:13 AM

## 2022-07-09 NOTE — Group Note (Signed)
Recreation Therapy Group Note   Group Topic:Team Building  Group Date: 07/09/2022 Start Time: 0930 End Time: 0955 Facilitators: Liliana Brentlinger-McCall, LRT,CTRS Location: [address] Dayroom   Goal Area(s) Addresses:  Patient will effectively work with peer towards shared goal.  Patient will identify skills used to make activity successful.  Patient will identify how skills used during activity can be applied to reach post d/c goals.   Group Description: [employer]. In teams of 5-6, patients were given 12 craft pipe cleaners. Using the materials provided, patients were instructed to compete again the opposing team(s) to build the tallest free-standing structure from floor level. The activity was timed; difficulty increased by Probation officer as Pharmacist, hospital continued.  Systematically resources were removed with additional directions for example, placing one arm behind their back, working in silence, and shape stipulations. LRT facilitated post-activity discussion reviewing team processes and necessary communication skills involved in completion. Patients were encouraged to reflect how the skills utilized, or not utilized, in this activity can be incorporated to positively impact support systems post discharge.   Affect/Mood: Appropriate   Participation Level: Engaged   Participation Quality: Independent   Behavior: Appropriate   Speech/Thought Process: Focused   Insight: Good   Judgement: Good   Modes of Intervention: STEM Activity   Patient Response to Interventions:  Engaged   Education Outcome:  Acknowledges education and In group clarification offered    Clinical Observations/Individualized Feedback: Pt engaged and worked well with peers in completion of activity.     Plan: Continue to engage patient in RT group sessions 2-3x/week.   Laksh Hinners-McCall, LRT,CTRS 07/09/2022 10:55 AM

## 2022-07-09 NOTE — Discharge Summary (Signed)
Physician Discharge Summary Note  Patient:  Hannah Reid is an 41 y.o., female MRN:  [id number] DOB:  1981/04/10 Patient phone:  [phone] (home)  Patient address:   [address] [place] [address],  Total Time spent with patient: 1.5 hours  Date of Admission:  07/06/2022 Date of Discharge: 07/09/2022  Reason for Admission:  Hannah Reid is a 41 yo Caucasian female with prior mental health history of MDD, GAD, and polysubstance abuse who presented to the [facility] with complaints of worsening depressive symptoms and SI with no plan, in the context of the impending death of her mother and other social stressors. Pt was involuntarily committed and transferred to this [facility] for treatment and stabilization of her mood. On assessment at time of admission, patient reported that she had taken "5 bars of Xanax" which belonged to her mother prior to presenting to [facility]. She reported that she had gone to [place] for medication refills because she does not have an outpatient mental health provider, and ran out of Hydroxyzine, and was about to run out of Seroquel. She denied that she was ever feeling suicidal prior to going to [facility]. She reported that she might have made some statements which she does not recall, in the context of Benzo intoxication. She repeatedly asked to be discharged after presenting to this [facility].   Principal Problem: MDD (major depressive disorder), recurrent severe, without psychosis (HCC) Discharge Diagnoses: Principal Problem:   MDD (major depressive disorder), recurrent severe, without psychosis (HCC) Active Problems:   Essential hypertension   GAD (generalized anxiety disorder)   Severe benzodiazepine use disorder (HCC)   Cannabis use disorder   Opioid use disorder  Past Psychiatric History: As above  Past Medical History:  Past Medical History:  Diagnosis Date   Hypertension     Past Surgical  History:  Procedure Laterality Date   ABDOMINAL HYSTERECTOMY     CHOLECYSTECTOMY     kidney tumor removal     Family History:  Family History  Problem Relation Age of Onset   Hypertension Mother    Cancer Mother        Breast Cancer   Hypertension Father    Family Psychiatric  History: none reported Social History:  Social History   Substance and Sexual Activity  Alcohol Use Not Currently   Comment: socially     Social History   Substance and Sexual Activity  Drug Use Not Currently   Types: Marijuana, Benzodiazepines   Comment: 5 years with no use    Social History   Socioeconomic History   Marital status: Single    Spouse name: Not on file   Number of children: Not on file   Years of education: Not on file   Highest education level: Not on file  Occupational History   Not on file  Tobacco Use   Smoking status: Every Day    Packs/day: 0.50    Years: 25.00    Total pack years: 12.50    Types: Cigarettes   Smokeless tobacco: Never  Vaping Use   Vaping Use: Every day   Substances: Flavoring  Substance and Sexual Activity   Alcohol use: Not Currently    Comment: socially   Drug use: Not Currently    Types: Marijuana, Benzodiazepines    Comment: 5 years with no use   Sexual activity: Yes  Other Topics Concern   Not on file  Social History Narrative   Not  on file   Social Determinants of Health   Financial Resource Strain: Not on file  Food Insecurity: No Food Insecurity (07/06/2022)   Hunger Vital Sign    Worried About Running Out of Food in the Last Year: Never true    Ran Out of Food in the Last Year: Never true  Transportation Needs: No Transportation Needs (07/06/2022)   PRAPARE - Administrator, Civil Service (Medical): No    Lack of Transportation (Non-Medical): No  Physical Activity: Not on file  Stress: Not on file  Social Connections: Not on file                                          HOSPITAL COURSE During the patient's  hospitalization, patient had extensive initial psychiatric evaluation, and follow-up psychiatric evaluations every day. Psychiatric diagnoses provided upon initial assessment were as follows:  Principal Problem:   MDD (major depressive disorder), recurrent severe, without psychosis (HCC) Active Problems:   Essential hypertension   GAD (generalized anxiety disorder)   Severe benzodiazepine use disorder (HCC)   Cannabis use disorder   Opioid use disorder   Patient's psychiatric medications were adjusted on admission as follows: MDD -Changed Seroquel from 600 mg QHS to 300 mg QHS (complaints of daytime grogginess on 600 mg-this is her home medication ) for mood stabilization -Started Seroquel XR 50 mg daily in the mornings for mood stabilization -Continued Prozac 10 mg daily for depressive symptoms (home med)   Anxiety -Continue Hydroxyzine 50 mg every 6 hours PRN (home med) -Continue Gabapentin 300 mg TID (home med) -Started Inderal 10 mg BID    Insomnia Continue Melatonin 10 mg nightly (home med) Started Trazodone 50 mg nightly PRN   Nicotine Dependence -Started Nicorette gum 2 mg PRN for nicotine dependence   During the hospitalization, other adjustments were made to the patient's psychiatric medication regimen, with medications at discharge being as follows: MDD -Continue Seroquel 300 mg QHS  for mood stabilization -Continue Seroquel XR 50 mg daily in the mornings for mood stabilization -Continue Prozac 10 mg daily for depressive symptoms   Anxiety -Continue Hydroxyzine 50 mg every 6 hours PRN -Continue Gabapentin 300 mg TID (home med) -Continue Inderal 10 mg BID    Insomnia Continue Melatonin 10 mg nightly  Continue Trazodone 50 mg nightly PRN   Gingivitis -Continue Clindamycin 300 mg Q 8 H x 6 days, then stop -Continue Orajel PRN   Nicotine Dependence -Continue Nicorette gum 2 mg PRN for nicotine dependence   Patient's care was discussed during the  interdisciplinary team meeting every day during the hospitalization. The patient denies having side effects to prescribed psychiatric medication.The patient was evaluated each day by a clinical provider to ascertain response to treatment. Improvement was noted by the patient's report of decreasing symptoms, improved sleep and appetite, affect, medication tolerance, behavior, and participation in unit programming.  Patient was asked each day to complete a self inventory noting mood, mental status, pain, new symptoms, anxiety and concerns.     Symptoms were reported as significantly decreased or resolved completely by discharge.    On day of discharge, the patient reports that their mood is stable. The patient denied having suicidal thoughts for more than 48 hours prior to discharge.  Patient denies having homicidal thoughts.  Patient denies having auditory hallucinations.  Patient denies any visual hallucinations or other symptoms of  psychosis. The patient was motivated to continue taking medication with a goal of continued improvement in mental health.    The patient reports their target psychiatric symptoms of depression, anxiety and insomnia responded well to the psychiatric medications, and the patient reports overall benefit other psychiatric hospitalization. Supportive psychotherapy was provided to the patient. The patient also participated in regular group therapy while hospitalized. Coping skills, problem solving as well as relaxation therapies were also part of the unit programming.   Labs reviewed with patient and abnormal results were discussed with the patient.: Lipid panel with cholesterol of 312, Tryglycerides 330, LDL-201, educated on healthy food choices and exercise and will need PCP f/u after discharge. Hemoglobin A1C and TSH WNL. CBC and CMP reviewed. UA with trace blood otherwise, WNL. Baseline EKG with Qtc 452.   Physical Findings: AIMS: Facial and Oral Movements Muscles of Facial  Expression: None, normal Lips and Perioral Area: None, normal Jaw: None, normal Tongue: None, normal,Extremity Movements Upper (arms, wrists, hands, fingers): None, normal Lower (legs, knees, ankles, toes): None, normal, Trunk Movements Neck, shoulders, hips: None, normal, Overall Severity Severity of abnormal movements (highest score from questions above): None, normal Incapacitation due to abnormal movements: None, normal Patient's awareness of abnormal movements (rate only patient's report): No Awareness, Dental Status Current problems with teeth and/or dentures?: No Does patient usually wear dentures?: No  CIWA:  CIWA-Ar Total: 0 COWS:  COWS Total Score: 0  Musculoskeletal: Strength & Muscle Tone: within normal limits Gait & Station: normal Patient leans: N/A  Psychiatric Specialty Exam:  Presentation  General Appearance:  Appropriate for Environment; Fairly Groomed  Eye Contact: Fair  Speech: Clear and Coherent  Speech Volume: Normal  Handedness: Right   Mood and Affect  Mood: Euthymic  Affect: Appropriate; Congruent   Thought Process  Thought Processes: Coherent  Descriptions of Associations:Intact  Orientation:Full (Time, Place and Person)  Thought Content:Logical  History of Schizophrenia/Schizoaffective disorder:No  Duration of Psychotic Symptoms:N/A  Hallucinations:Hallucinations: None  Ideas of Reference:None  Suicidal Thoughts:Suicidal Thoughts: No  Homicidal Thoughts:Homicidal Thoughts: No   Sensorium  Memory: Immediate Fair  Judgment: Good  Insight: Good   Executive Functions  Concentration: Good  Attention Span: Good  Recall: Good  Fund of Knowledge: Good  Language: Good   Psychomotor Activity  Psychomotor Activity: Psychomotor Activity: Normal   Assets  Assets: Communication Skills   Sleep  Sleep: Sleep: Good    Physical Exam: Physical Exam Review of Systems  Constitutional: Negative.    HENT: Negative.    Eyes: Negative.   Respiratory: Negative.    Cardiovascular: Negative.   Gastrointestinal: Negative.   Genitourinary: Negative.   Musculoskeletal: Negative.   Skin: Negative.   Neurological: Negative.   Psychiatric/Behavioral:  Positive for depression (denies SI/HI/AVH, denies paranoia and verbally contracts for safety outside of this [facility]) and substance abuse (educated on cessation). Negative for hallucinations, memory loss and suicidal ideas. The patient is nervous/anxious (resolving on current meds) and has insomnia (resolving on current meds).    Blood pressure (!) 113/94, pulse 91, temperature 98.3 F (36.8 C), temperature source Oral, resp. rate 16, height 5\' 5"  (1.651 m), weight 108.4 kg, SpO2 100 %. Body mass index is 39.77 kg/m.   Social History   Tobacco Use  Smoking Status Every Day   Packs/day: 0.50   Years: 25.00   Total pack years: 12.50   Types: Cigarettes  Smokeless Tobacco Never   Tobacco Cessation:  A prescription for an FDA-approved tobacco cessation medication provided  at discharge  Blood Alcohol level:  Lab Results  Component [facility]   [facility]   ETH <10 12/22/2021    Metabolic Disorder Labs:  Lab Results  Component Value Date   HGBA1C 5.1 07/07/2022   MPG 99.67 07/07/2022   MPG 100 10/19/2021   No results found for: "PROLACTIN" Lab Results  Component Value Date   CHOL 312 (H) 07/07/2022   TRIG 330 (H) 07/07/2022   HDL 45 07/07/2022   CHOLHDL 6.9 07/07/2022   VLDL 66 (H) 07/07/2022   LDLCALC 201 (H) 07/07/2022   LDLCALC 170 (H) 10/19/2021    See Psychiatric Specialty Exam and Suicide Risk Assessment completed by Attending Physician prior to discharge.  Discharge destination:  Home  Is patient on multiple antipsychotic therapies at discharge:  No   Has Patient had three or more failed trials of antipsychotic monotherapy by history:  No  Recommended Plan for Multiple Antipsychotic  Therapies: NA  Discharge Instructions     Diet - low sodium heart healthy   Complete by: As directed    Increase activity slowly   Complete by: As directed       Allergies as of 07/09/2022       Reactions   Augmentin [amoxicillin-pot Clavulanate]    Amoxicillin Rash        Medication List     TAKE these medications      Indication  benzocaine 10 % mucosal gel Commonly known as: ORAJEL Use as directed in the mouth or throat 3 (three) times daily as needed for mouth pain.  Indication: TOOTH PAIN   clindamycin 300 MG capsule Commonly known as: CLEOCIN Take 1 capsule (300 mg total) by mouth every 8 (eight) hours for 6 days.  Indication: Gum Inflammation   FLUoxetine 10 MG capsule Commonly known as: PROZAC Take 1 capsule (10 mg total) by mouth once daily.    gabapentin 300 MG capsule Commonly known as: NEURONTIN Take 1 capsule (300 mg total) by mouth 3 (three) times daily.  Indication: Alcohol Withdrawal Syndrome   hydrOXYzine 50 MG tablet Commonly known as: ATARAX Take 1 tablet (50 mg total) by mouth every 6 (six) hours as needed.  Indication: Feeling Anxious   lisinopril 5 MG tablet Commonly known as: ZESTRIL Take 1 tablet (5 mg total) by mouth once daily.  Indication: High Blood Pressure Disorder   Melatonin 10 MG Tbdp Take 10 mg by mouth at bedtime.  Indication: Trouble Sleeping   nicotine polacrilex 2 MG gum Commonly known as: NICORETTE Take 1 each (2 mg total) by mouth as needed for smoking cessation.  Indication: Nicotine Addiction   propranolol 10 MG tablet Commonly known as: INDERAL Take 1 tablet (10 mg total) by mouth 2 (two) times daily.  Indication: Feeling Anxious   QUEtiapine 300 MG tablet Commonly known as: SEROQUEL Take 1 tablet (300 mg total) by mouth at bedtime. What changed:  how much to take when to take this  Indication: Major Depressive Disorder   QUEtiapine 50 MG Tb24 24 hr tablet Commonly known as: SEROQUEL XR Take 1  tablet (50 mg total) by mouth daily. Start taking on: July 10, 2022 What changed: You were already taking a medication with the same name, and this prescription was added. Make sure you understand how and when to take each.  Indication: Major Depressive Disorder        Follow-up Information     [facility] Follow up on 07/16/2022.   Why: You  are scheduled for an intake appointment for therapy and medication management on 07/16/2022 at 10:00am.  This appointment will be held in-person but you may change it to virtual by calling the agency directly. Contact information: [place] [phone]                Follow-up recommendations:   The patient is able to verbalize their individual safety plan to this provider.   # It is recommended to the patient to continue psychiatric medications as prescribed, after discharge from the hospital.     # It is recommended to the patient to follow up with your outpatient psychiatric provider and PCP.   # It was discussed with the patient, the impact of alcohol, drugs, tobacco have been there overall psychiatric and medical wellbeing, and total abstinence from substance use was recommended the patient.ed.   # Prescriptions provided or sent directly to preferred pharmacy at discharge. Patient agreeable to plan. Given opportunity to ask questions. Appears to feel comfortable with discharge.    # In the event of worsening symptoms, the patient is instructed to call the crisis hotline (988), 911 and or go to the nearest ED for appropriate evaluation and treatment of symptoms. To follow-up with primary care provider for other medical issues, concerns and or health care needs   # Patient was discharged home with a plan to follow up as noted above.   Signed: Nicholes Rough, NP 07/09/2022, 2:05 PM

## 2022-07-09 NOTE — Group Note (Signed)
Date:  07/09/2022 Time:  9:49 AM  Group Topic/Focus:  Orientation:   The focus of this group is to educate the patient on the purpose and policies of crisis stabilization and provide a format to answer questions about their admission.  The group details unit policies and expectations of patients while admitted.    Participation Level:  Active  Participation Quality:  Appropriate  Affect:  Appropriate  Cognitive:  Appropriate  Insight: Appropriate  Engagement in Group:  Engaged  Modes of Intervention:  Discussion  Additional Comments:     Jerrye Beavers 07/09/2022, 9:49 AM

## 2022-07-09 NOTE — Progress Notes (Signed)
   07/08/22 2300  Psych Admission Type (Psych Patients Only)  Admission Status Involuntary  Psychosocial Assessment  Patient Complaints None  Eye Contact Fair  Facial Expression Animated  Affect Appropriate to circumstance  Speech Rapid;Pressured  Interaction Assertive  Motor Activity Hyperactive  Appearance/Hygiene Unremarkable  Behavior Characteristics Cooperative;Hyperactive  Mood Euphoric;Pleasant  Thought Process  Coherency WDL  Content WDL  Delusions None reported or observed  Perception WDL  Hallucination None reported or observed  Judgment Poor  Confusion None  Danger to Self  Current suicidal ideation? Denies  Danger to Others  Danger to Others None reported or observed   Patient alert and oriented, appropriate to circumstance and pleasant/euphoric mood. Patient denies SI, HI, AVH, and pain and states " I have no anxiety and depression, I would say 0/10 for both. I feel a lot better with the medicine I am taking. It has helped a lot." Scheduled medications administered to patient, per provider orders. Support and encouragement provided. Routine safety checks conducted every 15 minutes. Patient contracts for safety at this time and remains safe on the unit.

## 2022-07-15 ENCOUNTER — Other Ambulatory Visit: Payer: Self-pay

## 2022-07-15 ENCOUNTER — Ambulatory Visit: Payer: Self-pay | Admitting: Gerontology

## 2022-07-15 ENCOUNTER — Encounter: Payer: Self-pay | Admitting: Gerontology

## 2022-07-15 VITALS — BP 114/79 | HR 61 | Temp 97.9°F | Resp 16 | Ht 65.0 in | Wt 244.2 lb

## 2022-07-15 DIAGNOSIS — I10 Essential (primary) hypertension: Secondary | ICD-10-CM

## 2022-07-15 DIAGNOSIS — E785 Hyperlipidemia, unspecified: Secondary | ICD-10-CM

## 2022-07-15 DIAGNOSIS — F332 Major depressive disorder, recurrent severe without psychotic features: Secondary | ICD-10-CM

## 2022-07-15 MED ORDER — IBUPROFEN 800 MG PO TABS
ORAL_TABLET | ORAL | 0 refills | Status: DC
  Filled 2022-07-15: qty 25, 8d supply, fill #0

## 2022-07-15 MED ORDER — LISINOPRIL 5 MG PO TABS
5.0000 mg | ORAL_TABLET | Freq: Every day | ORAL | 0 refills | Status: DC
  Filled 2022-07-15: qty 90, 90d supply, fill #0

## 2022-07-15 MED ORDER — PROPRANOLOL HCL 10 MG PO TABS
10.0000 mg | ORAL_TABLET | Freq: Two times a day (BID) | ORAL | 2 refills | Status: AC
  Filled 2022-07-15: qty 60, 30d supply, fill #0

## 2022-07-15 NOTE — Progress Notes (Signed)
Established Patient Office Visit  Subjective   Patient ID: Hannah Reid, female    DOB: [birth year]  Age: 41 y.o. MRN: [id number]  Chief Complaint  Patient presents with   Hospitalization Follow-up    Patient admitted 07/03/22 to [facility]. Had labs drawn 07/07/22 and medication changes.    HPI  Hannah Reid is a 41 y.o. female who has history of GAD, Subsatnce use disorder, MDD, Hypertension, and prsent for routine follow up visit and lab review. She was discharged from [facility] on 10/13 23 and was treated for worsening depression and Suicidal ideation without plan. She has not picked up her psychotropic medications ordered during her hospitalization because of cost. Her Lipid panel done on 10/11 23 , LDL was 201 mg/dl, Triglyceride 734 mg/dl and total cholesterol was 312 mg/dl., ALT was 63 and the rest were unremarkable. She states that her mood is good, denies suicidal nor homicidal ideation. Overall, she states that she is doing well and offers no further complaint.        Patient Active Problem List   Diagnosis Date Noted   Elevated lipids 07/15/2022   GAD (generalized anxiety disorder) 07/07/2022   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   Cannabis use disorder 07/07/2022   Opioid use disorder 07/07/2022   MDD (major depressive disorder), recurrent severe, without psychosis (HCC) 07/06/2022   Laceration of left hand without foreign body    Essential hypertension 10/19/2021   Major depressive disorder, single episode, severe without psychotic features (HCC) 10/17/2021   Severe episode of recurrent major depressive disorder, without psychotic features (HCC) 10/17/2021   Aggressive behavior    Suicidal ideation    Past Medical History:  Diagnosis Date   Anxiety    Depression    Hypertension    Social History   Tobacco Use   Smoking status: Every Day    Packs/day: 0.30    Years: 25.00    Total pack years: 7.50    Types: Cigarettes   Smokeless tobacco:  Never   Tobacco comments:    [place] Quit info given  Vaping Use   Vaping Use: Some days   Substances: Nicotine, THC, CBD, Flavoring  Substance Use Topics   Alcohol use: Yes    Comment: rarely   Drug use: Not Currently    Types: Marijuana, Benzodiazepines, Heroin    Comment: last use 02/16/17 (has used heroin once since 02/16/17)   Allergies  Allergen Reactions   Augmentin [Amoxicillin-Pot Clavulanate]    Amoxicillin Rash      Review of Systems  Constitutional: Negative.   Eyes: Negative.   Respiratory: Negative.    Cardiovascular: Negative.   Neurological: Negative.   Psychiatric/Behavioral: Negative.        Objective:     BP 114/79 (BP Location: Right Arm, Patient Position: Sitting, Cuff Size: Large)   Pulse 61   Temp 97.9 F (36.6 C) (Oral)   Resp 16   Ht 5\' 5"  (1.651 m)   Wt 244 lb 3.2 oz (110.8 kg)   SpO2 97%   BMI 40.64 kg/m  BP Readings from Last 3 Encounters:  07/15/22 114/79  07/09/22 (!) 113/94  07/06/22 (!) 137/99   Wt Readings from Last 3 Encounters:  07/15/22 244 lb 3.2 oz (110.8 kg)  07/06/22 239 lb (108.4 kg)  07/03/22 244 lb 14.9 oz (111.1 kg)      Physical Exam HENT:     Head: Normocephalic and atraumatic.     Mouth/Throat:  Mouth: Mucous membranes are moist.  Cardiovascular:     Rate and Rhythm: Normal rate and regular rhythm.     Pulses: Normal pulses.     Heart sounds: Normal heart sounds.  Pulmonary:     Effort: Pulmonary effort is normal.     Breath sounds: Normal breath sounds.  Neurological:     General: No focal deficit present.     Mental Status: She is alert and oriented to person, place, and time. Mental status is at baseline.  Psychiatric:        Mood and Affect: Mood normal.        Behavior: Behavior normal.        Thought Content: Thought content normal.        Judgment: Judgment normal.      No results found for any visits on 07/15/22.  Last CBC Lab Results  Component Value Date   WBC 7.0 07/07/2022   HGB  14.5 07/07/2022   HCT 43.5 07/07/2022   MCV 82.7 07/07/2022   MCH 27.6 07/07/2022   RDW 12.8 07/07/2022   PLT 305 21/19/4174   Last metabolic panel Lab Results  Component Value Date   GLUCOSE 112 (H) 07/07/2022   NA 137 07/07/2022   K 3.6 07/07/2022   CL 105 07/07/2022   CO2 25 07/07/2022   BUN 22 (H) 07/07/2022   CREATININE 1.07 (H) 07/07/2022   GFRNONAA >60 07/07/2022   CALCIUM 9.6 07/07/2022   PROT 7.4 07/07/2022   ALBUMIN 4.1 07/07/2022   BILITOT 0.5 07/07/2022   ALKPHOS 74 07/07/2022   AST 33 07/07/2022   ALT 63 (H) 07/07/2022   ANIONGAP 7 07/07/2022   Last lipids Lab Results  Component Value Date   CHOL 312 (H) 07/07/2022   HDL 45 07/07/2022   LDLCALC 201 (H) 07/07/2022   TRIG 330 (H) 07/07/2022   CHOLHDL 6.9 07/07/2022   Last hemoglobin A1c Lab Results  Component Value Date   HGBA1C 5.1 07/07/2022   Last thyroid functions Lab Results  Component Value Date   TSH 3.965 07/07/2022      The ASCVD Risk score (Arnett DK, et al., 2019) failed to calculate for the following reasons:   The 2019 ASCVD risk score is only valid for ages 39 to 85    Assessment & Plan:   1. Hypertension, unspecified type - Her blood pressure is under control, will continue current medication, DASH diet and exercise as tolerated. - propranolol (INDERAL) 10 MG tablet; Take 1 tablet (10 mg total) by mouth 2 (two) times daily.  Dispense: 60 tablet; Refill: 2 - lisinopril (ZESTRIL) 5 MG tablet; Take 1 tablet (5 mg total) by mouth once daily.  Dispense: 90 tablet; Refill: 0  2. Elevated lipids - Unable to recheck fasting lipids because of her residency status, was advised to continue on low fat/cholesterol  and exercise as tolerated. - Lipid panel; Future  3. MDD (major depressive disorder), recurrent severe, without psychosis ([place]) - She was advised to continue on her discharge medication, schedule an appointment with [place] and advised to call the Crisis help line with worsening  symptoms.    Return in about 13 weeks (around 10/14/2022), or if symptoms worsen or fail to improve.    Gracieann Stannard Jerold Coombe, NP

## 2022-07-15 NOTE — Patient Instructions (Signed)
Heart-Healthy Eating Plan Eating a healthy diet is important for the health of your heart. A heart-healthy eating plan includes: Eating less unhealthy fats. Eating more healthy fats. Eating less salt in your food. Salt is also called sodium. Making other changes in your diet. Talk with your doctor or a diet specialist (dietitian) to create an eating plan that is right for you. What is my plan? Your doctor may recommend an eating plan that includes: Total fat: ______% or less of total calories a day. Saturated fat: ______% or less of total calories a day. Cholesterol: less than _________mg a day. Sodium: less than _________mg a day. What are tips for following this plan? Cooking Avoid frying your food. Try to bake, boil, grill, or broil it instead. You can also reduce fat by: Removing the skin from poultry. Removing all visible fats from meats. Steaming vegetables in water or broth. Meal planning  At meals, divide your plate into four equal parts: Fill one-half of your plate with vegetables and green salads. Fill one-fourth of your plate with whole grains. Fill one-fourth of your plate with lean protein foods. Eat 2-4 cups of vegetables per day. One cup of vegetables is: 1 cup (91 g) broccoli or cauliflower florets. 2 medium carrots. 1 large bell pepper. 1 large sweet potato. 1 large tomato. 1 medium white potato. 2 cups (150 g) raw leafy greens. Eat 1-2 cups of fruit per day. One cup of fruit is: 1 small apple 1 large banana 1 cup (237 g) mixed fruit, 1 large orange,  cup (82 g) dried fruit, 1 cup (240 mL) 100% fruit juice. Eat more foods that have soluble fiber. These are apples, broccoli, carrots, beans, peas, and barley. Try to get 20-30 g of fiber per day. Eat 4-5 servings of nuts, legumes, and seeds per week: 1 serving of dried beans or legumes equals  cup (90 g) cooked. 1 serving of nuts is  oz (12 almonds, 24 pistachios, or 7 walnut halves). 1 serving of seeds  equals  oz (8 g). General information Eat more home-cooked food. Eat less restaurant, buffet, and fast food. Limit or avoid alcohol. Limit foods that are high in starch and sugar. Avoid fried foods. Lose weight if you are overweight. Keep track of how much salt (sodium) you eat. This is important if you have high blood pressure. Ask your doctor to tell you more about this. Try to add vegetarian meals each week. Fats Choose healthy fats. These include olive oil and canola oil, flaxseeds, walnuts, almonds, and seeds. Eat more omega-3 fats. These include salmon, mackerel, sardines, tuna, flaxseed oil, and ground flaxseeds. Try to eat fish at least 2 times each week. Check food labels. Avoid foods with trans fats or high amounts of saturated fat. Limit saturated fats. These are often found in animal products, such as meats, butter, and cream. These are also found in plant foods, such as palm oil, palm kernel oil, and coconut oil. Avoid foods with partially hydrogenated oils in them. These have trans fats. Examples are stick margarine, some tub margarines, cookies, crackers, and other baked goods. What foods should I eat? Fruits All fresh, canned (in natural juice), or frozen fruits. Vegetables Fresh or frozen vegetables (raw, steamed, roasted, or grilled). Green salads. Grains Most grains. Choose whole wheat and whole grains most of the time. Rice and pasta, including brown rice and pastas made with whole wheat. Meats and other proteins Lean, well-trimmed beef, veal, pork, and lamb. Chicken and turkey without skin.   All fish and shellfish. Wild duck, rabbit, pheasant, and venison. Egg whites or low-cholesterol egg substitutes. Dried beans, peas, lentils, and tofu. Seeds and most nuts. Dairy Low-fat or nonfat cheeses, including ricotta and mozzarella. Skim or 1% milk that is liquid, powdered, or evaporated. Buttermilk that is made with low-fat milk. Nonfat or low-fat yogurt. Fats and  oils Non-hydrogenated (trans-free) margarines. Vegetable oils, including soybean, sesame, sunflower, olive, peanut, safflower, corn, canola, and cottonseed. Salad dressings or mayonnaise made with a vegetable oil. Beverages Mineral water. Coffee and tea. Diet carbonated beverages. Sweets and desserts Sherbet, gelatin, and fruit ice. Small amounts of dark chocolate. Limit all sweets and desserts. Seasonings and condiments All seasonings and condiments. The items listed above may not be a complete list of foods and drinks you can eat. Contact a dietitian for more options. What foods should I avoid? Fruits Canned fruit in heavy syrup. Fruit in cream or butter sauce. Fried fruit. Limit coconut. Vegetables Vegetables cooked in cheese, cream, or butter sauce. Fried vegetables. Grains Breads that are made with saturated or trans fats, oils, or whole milk. Croissants. Sweet rolls. Donuts. High-fat crackers, such as cheese crackers. Meats and other proteins Fatty meats, such as hot dogs, ribs, sausage, bacon, rib-eye roast or steak. High-fat deli meats, such as salami and bologna. Caviar. Domestic duck and goose. Organ meats, such as liver. Dairy Cream, sour cream, cream cheese, and creamed cottage cheese. Whole-milk cheeses. Whole or 2% milk that is liquid, evaporated, or condensed. Whole buttermilk. Cream sauce or high-fat cheese sauce. Yogurt that is made from whole milk. Fats and oils Meat fat, or shortening. Cocoa butter, hydrogenated oils, palm oil, coconut oil, palm kernel oil. Solid fats and shortenings, including bacon fat, salt pork, lard, and butter. Nondairy cream substitutes. Salad dressings with cheese or sour cream. Beverages Regular sodas and juice drinks with added sugar. Sweets and desserts Frosting. Pudding. Cookies. Cakes. Pies. Milk chocolate or white chocolate. Buttered syrups. Full-fat ice cream or ice cream drinks. The items listed above may not be a complete list of foods  and drinks to avoid. Contact a dietitian for more information. Summary Heart-healthy meal planning includes eating less unhealthy fats, eating more healthy fats, and making other changes in your diet. Eat a balanced diet. This includes fruits and vegetables, low-fat or nonfat dairy, lean protein, nuts and legumes, whole grains, and heart-healthy oils and fats. This information is not intended to replace advice given to you by your health care provider. Make sure you discuss any questions you have with your health care provider. Document Revised: 10/19/2021 Document Reviewed: 10/19/2021 Elsevier Patient Education  2023 Elsevier Inc. DASH Eating Plan DASH stands for Dietary Approaches to Stop Hypertension. The DASH eating plan is a healthy eating plan that has been shown to: Reduce high blood pressure (hypertension). Reduce your risk for type 2 diabetes, heart disease, and stroke. Help with weight loss. What are tips for following this plan? Reading food labels Check food labels for the amount of salt (sodium) per serving. Choose foods with less than 5 percent of the Daily Value of sodium. Generally, foods with less than 300 milligrams (mg) of sodium per serving fit into this eating plan. To find whole grains, look for the word "whole" as the first word in the ingredient list. Shopping Buy products labeled as "low-sodium" or "no salt added." Buy fresh foods. Avoid canned foods and pre-made or frozen meals. Cooking Avoid adding salt when cooking. Use salt-free seasonings or herbs instead of table salt   or sea salt. Check with your health care provider or pharmacist before using salt substitutes. Do not fry foods. Cook foods using healthy methods such as baking, boiling, grilling, roasting, and broiling instead. Cook with heart-healthy oils, such as olive, canola, avocado, soybean, or sunflower oil. Meal planning  Eat a balanced diet that includes: 4 or more servings of fruits and 4 or more  servings of vegetables each day. Try to fill one-half of your plate with fruits and vegetables. 6-8 servings of whole grains each day. Less than 6 oz (170 g) of lean meat, poultry, or fish each day. A 3-oz (85-g) serving of meat is about the same size as a deck of cards. One egg equals 1 oz (28 g). 2-3 servings of low-fat dairy each day. One serving is 1 cup (237 mL). 1 serving of nuts, seeds, or beans 5 times each week. 2-3 servings of heart-healthy fats. Healthy fats called omega-3 fatty acids are found in foods such as walnuts, flaxseeds, fortified milks, and eggs. These fats are also found in cold-water fish, such as sardines, salmon, and mackerel. Limit how much you eat of: Canned or prepackaged foods. Food that is high in trans fat, such as some fried foods. Food that is high in saturated fat, such as fatty meat. Desserts and other sweets, sugary drinks, and other foods with added sugar. Full-fat dairy products. Do not salt foods before eating. Do not eat more than 4 egg yolks a week. Try to eat at least 2 vegetarian meals a week. Eat more home-cooked food and less restaurant, buffet, and fast food. Lifestyle When eating at a restaurant, ask that your food be prepared with less salt or no salt, if possible. If you drink alcohol: Limit how much you use to: 0-1 drink a day for women who are not pregnant. 0-2 drinks a day for men. Be aware of how much alcohol is in your drink. In the [place] one drink equals one 12 oz bottle of beer (355 mL), one 5 oz glass of wine (148 mL), or one 1 oz glass of hard liquor (44 mL). General information Avoid eating more than 2,300 mg of salt a day. If you have hypertension, you may need to reduce your sodium intake to 1,500 mg a day. Work with your health care provider to maintain a healthy body weight or to lose weight. Ask what an ideal weight is for you. Get at least 30 minutes of exercise that causes your heart to beat faster (aerobic exercise) most  days of the week. Activities may include walking, swimming, or biking. Work with your health care provider or dietitian to adjust your eating plan to your individual calorie needs. What foods should I eat? Fruits All fresh, dried, or frozen fruit. Canned fruit in natural juice (without added sugar). Vegetables Fresh or frozen vegetables (raw, steamed, roasted, or grilled). Low-sodium or reduced-sodium tomato and vegetable juice. Low-sodium or reduced-sodium tomato sauce and tomato paste. Low-sodium or reduced-sodium canned vegetables. Grains Whole-grain or whole-wheat bread. Whole-grain or whole-wheat pasta. Brown rice. Oatmeal. Quinoa. Bulgur. Whole-grain and low-sodium cereals. Pita bread. Low-fat, low-sodium crackers. Whole-wheat flour tortillas. Meats and other proteins Skinless chicken or turkey. Ground chicken or turkey. Pork with fat trimmed off. Fish and seafood. Egg whites. Dried beans, peas, or lentils. Unsalted nuts, nut butters, and seeds. Unsalted canned beans. Lean cuts of beef with fat trimmed off. Low-sodium, lean precooked or cured meat, such as sausages or meat loaves. Dairy Low-fat (1%) or fat-free (skim) milk.   Reduced-fat, low-fat, or fat-free cheeses. Nonfat, low-sodium ricotta or cottage cheese. Low-fat or nonfat yogurt. Low-fat, low-sodium cheese. Fats and oils Soft margarine without trans fats. Vegetable oil. Reduced-fat, low-fat, or light mayonnaise and salad dressings (reduced-sodium). Canola, safflower, olive, avocado, soybean, and sunflower oils. Avocado. Seasonings and condiments Herbs. Spices. Seasoning mixes without salt. Other foods Unsalted popcorn and pretzels. Fat-free sweets. The items listed above may not be a complete list of foods and beverages you can eat. Contact a dietitian for more information. What foods should I avoid? Fruits Canned fruit in a light or heavy syrup. Fried fruit. Fruit in cream or butter sauce. Vegetables Creamed or fried vegetables.  Vegetables in a cheese sauce. Regular canned vegetables (not low-sodium or reduced-sodium). Regular canned tomato sauce and paste (not low-sodium or reduced-sodium). Regular tomato and vegetable juice (not low-sodium or reduced-sodium). Pickles. Olives. Grains Baked goods made with fat, such as croissants, muffins, or some breads. Dry pasta or rice meal packs. Meats and other proteins Fatty cuts of meat. Ribs. Fried meat. Bacon. Bologna, salami, and other precooked or cured meats, such as sausages or meat loaves. Fat from the back of a pig (fatback). Bratwurst. Salted nuts and seeds. Canned beans with added salt. Canned or smoked fish. Whole eggs or egg yolks. Chicken or turkey with skin. Dairy Whole or 2% milk, cream, and half-and-half. Whole or full-fat cream cheese. Whole-fat or sweetened yogurt. Full-fat cheese. Nondairy creamers. Whipped toppings. Processed cheese and cheese spreads. Fats and oils Butter. Stick margarine. Lard. Shortening. Ghee. Bacon fat. Tropical oils, such as coconut, palm kernel, or palm oil. Seasonings and condiments Onion salt, garlic salt, seasoned salt, table salt, and sea salt. Worcestershire sauce. Tartar sauce. Barbecue sauce. Teriyaki sauce. Soy sauce, including reduced-sodium. Steak sauce. Canned and packaged gravies. Fish sauce. Oyster sauce. Cocktail sauce. Store-bought horseradish. Ketchup. Mustard. Meat flavorings and tenderizers. Bouillon cubes. Hot sauces. Pre-made or packaged marinades. Pre-made or packaged taco seasonings. Relishes. Regular salad dressings. Other foods Salted popcorn and pretzels. The items listed above may not be a complete list of foods and beverages you should avoid. Contact a dietitian for more information. Where to find more information National Heart, Lung, and Blood Institute: www.nhlbi.nih.gov American Heart Association: www.heart.org Academy of Nutrition and Dietetics: www.eatright.org National Kidney Foundation:  www.kidney.org Summary The DASH eating plan is a healthy eating plan that has been shown to reduce high blood pressure (hypertension). It may also reduce your risk for type 2 diabetes, heart disease, and stroke. When on the DASH eating plan, aim to eat more fresh fruits and vegetables, whole grains, lean proteins, low-fat dairy, and heart-healthy fats. With the DASH eating plan, you should limit salt (sodium) intake to 2,300 mg a day. If you have hypertension, you may need to reduce your sodium intake to 1,500 mg a day. Work with your health care provider or dietitian to adjust your eating plan to your individual calorie needs. This information is not intended to replace advice given to you by your health care provider. Make sure you discuss any questions you have with your health care provider. Document Revised: 08/17/2019 Document Reviewed: 08/17/2019 Elsevier Patient Education  2023 Elsevier Inc.  

## 2022-07-16 ENCOUNTER — Other Ambulatory Visit: Payer: Self-pay

## 2022-08-13 ENCOUNTER — Other Ambulatory Visit: Payer: Self-pay

## 2022-09-13 ENCOUNTER — Ambulatory Visit (HOSPITAL_COMMUNITY)
Admission: EM | Admit: 2022-09-13 | Discharge: 2022-09-13 | Disposition: A | Discharge status: Home / Self Care | Payer: Self-pay | Attending: Physician Assistant | Admitting: Physician Assistant

## 2022-09-13 ENCOUNTER — Ambulatory Visit (INDEPENDENT_AMBULATORY_CARE_PROVIDER_SITE_OTHER): Payer: Self-pay

## 2022-09-13 ENCOUNTER — Other Ambulatory Visit: Payer: Self-pay

## 2022-09-13 ENCOUNTER — Emergency Department (HOSPITAL_COMMUNITY)
Admission: EM | Admit: 2022-09-13 | Discharge: 2022-09-13 | Disposition: A | Discharge status: Home / Self Care | Payer: Self-pay

## 2022-09-13 ENCOUNTER — Encounter (HOSPITAL_COMMUNITY): Payer: Self-pay | Admitting: *Deleted

## 2022-09-13 DIAGNOSIS — I700 Atherosclerosis of aorta: Secondary | ICD-10-CM | POA: Insufficient documentation

## 2022-09-13 DIAGNOSIS — H6692 Otitis media, unspecified, left ear: Secondary | ICD-10-CM | POA: Insufficient documentation

## 2022-09-13 DIAGNOSIS — H6690 Otitis media, unspecified, unspecified ear: Secondary | ICD-10-CM

## 2022-09-13 DIAGNOSIS — Z792 Long term (current) use of antibiotics: Secondary | ICD-10-CM | POA: Insufficient documentation

## 2022-09-13 DIAGNOSIS — Z79899 Other long term (current) drug therapy: Secondary | ICD-10-CM | POA: Insufficient documentation

## 2022-09-13 DIAGNOSIS — J101 Influenza due to other identified influenza virus with other respiratory manifestations: Secondary | ICD-10-CM | POA: Insufficient documentation

## 2022-09-13 DIAGNOSIS — R059 Cough, unspecified: Secondary | ICD-10-CM

## 2022-09-13 DIAGNOSIS — R509 Fever, unspecified: Secondary | ICD-10-CM | POA: Insufficient documentation

## 2022-09-13 DIAGNOSIS — M47814 Spondylosis without myelopathy or radiculopathy, thoracic region: Secondary | ICD-10-CM | POA: Insufficient documentation

## 2022-09-13 DIAGNOSIS — Z1152 Encounter for screening for COVID-19: Secondary | ICD-10-CM | POA: Insufficient documentation

## 2022-09-13 LAB — RESP PANEL BY RT-PCR (RSV, FLU A&B, COVID)  RVPGX2
Influenza A by PCR: POSITIVE — AB
Influenza B by PCR: NEGATIVE
Resp Syncytial Virus by PCR: NEGATIVE
SARS Coronavirus 2 by RT PCR: NEGATIVE

## 2022-09-13 MED ORDER — CEFDINIR 300 MG PO CAPS: 300.0000 mg | ORAL_CAPSULE | Freq: Two times a day (BID) | ORAL | 0 refills | Status: DC

## 2022-09-13 MED ORDER — IBUPROFEN 800 MG PO TABS
ORAL_TABLET | ORAL | Status: AC
  Filled 2022-09-13: qty 1

## 2022-09-13 MED ORDER — IBUPROFEN 800 MG PO TABS
800.0000 mg | ORAL_TABLET | Freq: Once | ORAL | Status: AC
  Administered 2022-09-13: 800 mg via ORAL

## 2022-09-13 MED ORDER — IBUPROFEN 800 MG PO TABS: 800.0000 mg | ORAL_TABLET | Freq: Three times a day (TID) | ORAL | 0 refills | Status: AC | PRN

## 2022-09-13 MED ORDER — ONDANSETRON 4 MG PO TBDP: 4.0000 mg | ORAL_TABLET | Freq: Three times a day (TID) | ORAL | 0 refills | Status: DC | PRN

## 2022-09-13 NOTE — Discharge Instructions (Addendum)
Ibuprofen for fever and pain.

## 2022-09-13 NOTE — ED Provider Notes (Addendum)
[facility]    CSN: [id number] Arrival date & time: 09/13/22  1727      History   Chief Complaint Chief Complaint  Patient presents with   Cough   Generalized Body Aches   Emesis   Otalgia    LT    HPI Hannah Reid is a 41 y.o. female.   Pt complains of a fever.  Pt reports she was seen in [place] and started on doxycycline for an ear infection.  Pt reports she had a negative covid and influenza test.  Pt reports no improvement with doxy.  The history is provided by the patient and a friend.  Cough Cough characteristics:  Non-productive Sputum characteristics:  Nondescript Duration:  3 days Progression:  Worsening Smoker: no   Relieved by:  Nothing Associated symptoms: ear pain and fever   Emesis Associated symptoms: cough and fever   Otalgia Location:  Left Behind ear:  No abnormality Severity:  Moderate Associated symptoms: cough, fever and vomiting     Past Medical History:  Diagnosis Date   Anxiety    Depression    Hypertension     Patient Active Problem List   Diagnosis Date Noted   Elevated lipids 07/15/2022   GAD (generalized anxiety disorder) 07/07/2022   Severe benzodiazepine use disorder ([place]) 07/07/2022   Cannabis use disorder 07/07/2022   Opioid use disorder 07/07/2022   MDD (major depressive disorder), recurrent severe, 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      Home Medications    Prior to Admission medications   Medication  Sig Start Date End Date Taking? Authorizing Provider  benzocaine (ORAJEL) 10 % mucosal gel Use as directed in the mouth or throat 3 (three) times daily as needed for mouth pain. Patient not taking: Reported on 07/15/2022 07/09/22   Janine Limbo, MD  FLUoxetine (PROZAC) 10 MG capsule Take 1 capsule (10 mg total) by mouth once daily. 07/09/22 08/14/22  Massengill, Ovid Curd, MD  gabapentin (NEURONTIN) 300 MG capsule Take 1 capsule (300 mg total) by mouth 3 (three) times daily. 07/09/22 08/14/22  Massengill, Ovid Curd, MD  ibuprofen (ADVIL) 800 MG tablet Take one tablet by mouth every 8 hours as needed 06/28/22     lisinopril (ZESTRIL) 5 MG tablet Take 1 tablet (5 mg total) by mouth once daily. 07/15/22   Iloabachie, Chioma E, NP  Melatonin 10 MG TBDP Take 10 mg by mouth at bedtime. 01/21/22   Tukov-Yual, Arlyss Gandy, NP  nicotine polacrilex (NICORETTE) 2 MG gum Take 1 each (2 mg total) by mouth as needed for smoking cessation. Patient not taking: Reported on 07/15/2022 07/09/22   Janine Limbo, MD  propranolol (INDERAL) 10 MG tablet Take 1 tablet (10 mg total) by mouth 2 (two) times daily. 07/15/22 08/14/22  Iloabachie, Chioma E, NP  QUEtiapine (SEROQUEL XR) 50 MG TB24 24 hr tablet Take 1 tablet (50 mg total) by mouth daily. 07/10/22 08/14/22  Massengill, Ovid Curd, MD  QUEtiapine (SEROQUEL) 300 MG tablet  Take 1 tablet (300 mg total) by mouth at bedtime. 07/09/22 08/14/22  Janine Limbo, MD    Family History Family History  Problem Relation Age of Onset   Hyperlipidemia Mother    Cancer Mother        skin cancer   Heart disease Father    Hypertension Father    Heart attack Maternal Aunt    Other Maternal Grandmother        unknown medical history   Other Maternal Grandfather        unknown medical history   Alzheimer's disease Paternal Grandmother    Heart disease Paternal Grandfather    Hypertension Paternal Grandfather     Social History Social History   Tobacco Use    Smoking status: Every Day    Packs/day: 0.30    Years: 25.00    Total pack years: 7.50    Types: Cigarettes   Smokeless tobacco: Never   Tobacco comments:    [place] Quit info given  Vaping Use   Vaping Use: Some days   Substances: Nicotine, THC, CBD, Flavoring  Substance Use Topics   Alcohol use: Yes    Comment: rarely   Drug use: Not Currently    Types: Marijuana, Benzodiazepines, Heroin    Comment: last use 02/16/17 (has used heroin once since 02/16/17)     Allergies   Augmentin [amoxicillin-pot clavulanate] and Amoxicillin   Review of Systems Review of Systems  Constitutional:  Positive for fever.  HENT:  Positive for ear pain.   Respiratory:  Positive for cough.   Gastrointestinal:  Positive for vomiting.  All other systems reviewed and are negative.    Physical Exam Triage Vital Signs ED Triage Vitals  Enc Vitals Group     BP 09/13/22 1917 (!) 139/90     Pulse Rate 09/13/22 1917 99     Resp 09/13/22 1917 18     Temp 09/13/22 1917 (!) 102.8 F (39.3 C)     Temp src --      SpO2 09/13/22 1917 97 %     Weight --      Height --      Head Circumference --      Peak Flow --      Pain Score 09/13/22 1915 10     Pain Loc --      Pain Edu? --      Excl. in [place]? --    No data found.  Updated Vital Signs BP (!) 139/90   Pulse 99   Temp (!) 102.8 F (39.3 C)   Resp 18   SpO2 97%   Visual Acuity Right Eye Distance:   Left Eye Distance:   Bilateral Distance:    Right Eye Near:   Left Eye Near:    Bilateral Near:     Physical Exam Vitals and nursing note reviewed.  Constitutional:      Appearance: Normal appearance. She is well-developed.  HENT:     Head: Normocephalic.     Ears:     Comments: Left tm erythematous     Nose: Nose normal.     Mouth/Throat:     Mouth: Mucous membranes are moist.  Eyes:     Pupils: Pupils are equal, round, and reactive to light.  Cardiovascular:     Rate and Rhythm: Normal rate.  Pulmonary:     Effort: Pulmonary  effort is normal.  Abdominal:     General: There is no distension.  Musculoskeletal:  General: Normal range of motion.     Cervical back: Normal range of motion.  Skin:    General: Skin is warm.  Neurological:     Mental Status: She is alert and oriented to person, place, and time.  Psychiatric:        Mood and Affect: Mood normal.      UC Treatments / Results  Labs (all labs ordered are listed, but only abnormal results are displayed) Labs Reviewed - No data to display  EKG   Radiology DG Chest 2 View  Result Date: 09/13/2022 CLINICAL DATA:  Cough. EXAM: CHEST - 2 VIEW COMPARISON:  AP chest 03/11/2022, chest two views 01/25/2022 FINDINGS: Cardiac silhouette and mediastinal contours are within normal limits. Mild calcification within aortic arch. Mildly decreased lung volumes. The lungs are clear. No pleural effusion or pneumothorax. Mild multilevel degenerative disc changes of the thoracic spine. Cholecystectomy clips. IMPRESSION: No active cardiopulmonary disease. Electronically Signed   By: Neita Garnet M.D.   On: 09/13/2022 20:04    Procedures Procedures (including critical care time)  Medications Ordered in UC Medications  ibuprofen (ADVIL) tablet 800 mg (800 mg Oral Given 09/13/22 1939)    Initial Impression / Assessment and Plan / UC Course  I have reviewed the triage vital signs and the nursing notes.  Pertinent labs & imaging results that were available during my care of the patient were reviewed by me and considered in my medical decision making (see chart for details).     MDM:  Chest xray negative.  I will repeat covid and influenza,  this illness sounds like influenza.  I will change pt from doxycycline to omnicefg  Final Clinical Impressions(s) / UC Diagnoses   Final diagnoses:  Fever, unspecified  Left otitis media, unspecified otitis media type   Discharge Instructions   None    ED Prescriptions     Medication Sig Dispense Auth.  Provider   cefdinir (OMNICEF) 300 MG capsule Take 1 capsule (300 mg total) by mouth 2 (two) times daily. 20 capsule Catherine Oak K, [place]   ibuprofen (ADVIL) 800 MG tablet Take 1 tablet (800 mg total) by mouth every 8 (eight) hours as needed. 30 tablet Shaaron Golliday K, PA-C   ondansetron (ZOFRAN-ODT) 4 MG disintegrating tablet Take 1 tablet (4 mg total) by mouth every 8 (eight) hours as needed for nausea or vomiting. 20 tablet Elson Areas, [place]      PDMP not reviewed this encounter. An After Visit Summary was printed and given to the patient.       Elson Areas, PA-C 09/13/22 2015    Elson Areas, PA-C 09/13/22 2020    Elson Areas, PA-C 09/17/22 1520    Elson Areas, [place] 10/29/22 [phone]

## 2022-09-13 NOTE — ED Triage Notes (Signed)
PT reports vomiting started 4 days ago and Pt reports she has been sick for 2 weeks. Pt has been to [facility] and was seen and treated.

## 2022-09-14 ENCOUNTER — Telehealth: Payer: Self-pay | Admitting: Emergency Medicine

## 2022-09-14 MED ORDER — PROMETHAZINE-DM 6.25-15 MG/5ML PO SYRP: 5.0000 mL | ORAL_SOLUTION | Freq: Four times a day (QID) | ORAL | 0 refills | Status: AC | PRN

## 2022-10-04 ENCOUNTER — Other Ambulatory Visit: Payer: Self-pay

## 2022-10-14 ENCOUNTER — Ambulatory Visit: Payer: Self-pay | Admitting: Gerontology

## 2023-01-06 ENCOUNTER — Other Ambulatory Visit: Payer: Self-pay

## 2023-01-06 ENCOUNTER — Emergency Department
Admission: EM | Admit: 2023-01-06 | Discharge: 2023-01-06 | Disposition: A | Discharge status: Home / Self Care | Payer: Self-pay | Attending: Emergency Medicine | Admitting: Emergency Medicine

## 2023-01-06 DIAGNOSIS — Z1152 Encounter for screening for COVID-19: Secondary | ICD-10-CM | POA: Insufficient documentation

## 2023-01-06 DIAGNOSIS — Z79899 Other long term (current) drug therapy: Secondary | ICD-10-CM | POA: Insufficient documentation

## 2023-01-06 DIAGNOSIS — H938X2 Other specified disorders of left ear: Secondary | ICD-10-CM | POA: Insufficient documentation

## 2023-01-06 DIAGNOSIS — I10 Essential (primary) hypertension: Secondary | ICD-10-CM | POA: Insufficient documentation

## 2023-01-06 DIAGNOSIS — Z760 Encounter for issue of repeat prescription: Secondary | ICD-10-CM | POA: Insufficient documentation

## 2023-01-06 DIAGNOSIS — J069 Acute upper respiratory infection, unspecified: Secondary | ICD-10-CM | POA: Insufficient documentation

## 2023-01-06 LAB — SARS CORONAVIRUS 2 BY RT PCR: SARS Coronavirus 2 by RT PCR: NEGATIVE

## 2023-01-06 MED ORDER — FLUOXETINE HCL 10 MG PO CAPS
10.0000 mg | ORAL_CAPSULE | Freq: Every day | ORAL | 0 refills | Status: DC
  Filled 2023-01-06: qty 30, 30d supply, fill #0

## 2023-01-06 MED ORDER — DEXAMETHASONE SODIUM PHOSPHATE 10 MG/ML IJ SOLN
10.0000 mg | Freq: Once | INTRAMUSCULAR | Status: AC
  Administered 2023-01-06: 10 mg via INTRAMUSCULAR
  Filled 2023-01-06: qty 1

## 2023-01-06 MED ORDER — ONDANSETRON 4 MG PO TBDP
4.0000 mg | ORAL_TABLET | Freq: Once | ORAL | Status: AC
  Administered 2023-01-06: 4 mg via ORAL
  Filled 2023-01-06: qty 1

## 2023-01-06 MED ORDER — QUETIAPINE FUMARATE ER 50 MG PO TB24
50.0000 mg | ORAL_TABLET | Freq: Every day | ORAL | 0 refills | Status: AC
  Filled 2023-01-06: qty 14, 14d supply, fill #0

## 2023-01-06 MED ORDER — LISINOPRIL 5 MG PO TABS
5.0000 mg | ORAL_TABLET | Freq: Every day | ORAL | 0 refills | Status: AC
  Filled 2023-01-06: qty 30, 30d supply, fill #0

## 2023-01-06 MED ORDER — GABAPENTIN 300 MG PO CAPS
300.0000 mg | ORAL_CAPSULE | Freq: Three times a day (TID) | ORAL | 0 refills | Status: AC
  Filled 2023-01-06: qty 90, 30d supply, fill #0

## 2023-01-06 NOTE — ED Triage Notes (Signed)
Pt to ED for nasal congestion, left ear pain for past few days. NAD noted

## 2023-01-06 NOTE — ED Provider Notes (Signed)
[facility] Provider Note    Event Date/Time   First MD Initiated Contact with Patient 01/06/23 [phone]     (approximate)   History   Ear Fullness   HPI  Hannah Reid is a 42 y.o. female with history of hypertension, substance use, and as listed in EMR presents to the emergency department for treatment and evaluation of left ear fullness, headache, nasal congestion, nausea and vomiting.  Symptoms started about 3 days ago.  She feels that the nausea and vomiting is related to not having any of her medications for the past 2 weeks.  She reports that she and her significant other recently moved back to the area and have not been able to reestablish care with [facility] which is where she had previously received her health care and medications.  Patient currently denies suicidal ideations, homicidal ideations or excessive anger.      Physical Exam   Triage Vital Signs: ED Triage Vitals  Enc Vitals Group     BP 01/06/23 0735 (!) 138/110     Pulse Rate 01/06/23 0735 79     Resp 01/06/23 0735 18     Temp 01/06/23 0735 98.4 F (36.9 C)     Temp src --      SpO2 01/06/23 0735 97 %     Weight 01/06/23 0734 245 lb (111.1 kg)     Height 01/06/23 0734 5\' 4"  (1.626 m)     Head Circumference --      Peak Flow --      Pain Score 01/06/23 0734 6     Pain Loc --      Pain Edu? --      Excl. in GC? --     Most recent vital signs: Vitals:   01/06/23 0735  BP: (!) 138/110  Pulse: 79  Resp: 18  Temp: 98.4 F (36.9 C)  SpO2: 97%    General: Awake, no distress.  CV:  Good peripheral perfusion.  Resp:  Normal effort.  Abd:  No distention.  Other:  TM injected bilaterally without evidence of OM   ED Results / Procedures / Treatments   Labs (all labs ordered are listed, but only abnormal results are displayed) Labs Reviewed  SARS CORONAVIRUS 2 BY RT PCR     EKG  RADIOLOGY  PROCEDURES:  Critical Care performed:  No  Procedures   MEDICATIONS ORDERED IN ED:  Medications  ondansetron (ZOFRAN-ODT) disintegrating tablet 4 mg (4 mg Oral Given 01/06/23 0809)  dexamethasone (DECADRON) injection 10 mg (10 mg Intramuscular Given 01/06/23 0945)     IMPRESSION / MDM / ASSESSMENT AND PLAN / ED COURSE   I have reviewed the triage note.  Differential diagnosis includes, but is not limited to, COVID, influenza, URI, seasonal allergies, medication withdrawal/medication reaction.  Patient's presentation is most consistent with acute illness / injury with system symptoms.  42 year old female presenting to the emergency department for treatment and evaluation of viral symptoms as listed in the HPI.  Exam is reassuring.  Plan will be to give her Zofran and collect COVID testing.  COVID test is negative.  Nausea is resolved after Zofran.  Prior to discharge she will receive Decadron which hopefully will help with her sinus pressure and earache. Prescriptions sent to [facility]. She was advised she will need to get in with PCP as soon as possible. She was also reminded of [facility] if she needs behavioral healthcare.  FINAL CLINICAL IMPRESSION(S) / ED DIAGNOSES   Final diagnoses:  Upper respiratory tract infection, unspecified type  Medication refill     Rx / DC Orders   ED Discharge Orders          Ordered    FLUoxetine (PROZAC) 10 MG capsule  Daily        01/06/23 0942    gabapentin (NEURONTIN) 300 MG capsule  3 times daily        01/06/23 0942    lisinopril (ZESTRIL) 5 MG tablet  Daily        01/06/23 0942    QUEtiapine (SEROQUEL XR) 50 MG TB24 24 hr tablet  Daily        01/06/23 0942             Note:  This document was prepared using Dragon voice recognition software and may include unintentional dictation errors.   Chinita Pester, FNP 01/06/23 1257    Pilar Jarvis, MD 01/06/23 [phone]

## 2023-01-07 ENCOUNTER — Other Ambulatory Visit: Payer: Self-pay

## 2023-01-26 ENCOUNTER — Other Ambulatory Visit: Payer: Self-pay

## 2023-01-27 ENCOUNTER — Other Ambulatory Visit: Payer: Self-pay

## 2023-02-13 ENCOUNTER — Emergency Department: Payer: Self-pay

## 2023-02-13 ENCOUNTER — Other Ambulatory Visit: Payer: Self-pay

## 2023-02-13 ENCOUNTER — Emergency Department
Admission: EM | Admit: 2023-02-13 | Discharge: 2023-02-13 | Disposition: A | Discharge status: Home / Self Care | Payer: Self-pay | Attending: Emergency Medicine | Admitting: Emergency Medicine

## 2023-02-13 DIAGNOSIS — I10 Essential (primary) hypertension: Secondary | ICD-10-CM | POA: Insufficient documentation

## 2023-02-13 DIAGNOSIS — M25562 Pain in left knee: Secondary | ICD-10-CM | POA: Insufficient documentation

## 2023-02-13 DIAGNOSIS — Z79899 Other long term (current) drug therapy: Secondary | ICD-10-CM | POA: Insufficient documentation

## 2023-02-13 DIAGNOSIS — G8929 Other chronic pain: Secondary | ICD-10-CM | POA: Insufficient documentation

## 2023-02-13 DIAGNOSIS — F17210 Nicotine dependence, cigarettes, uncomplicated: Secondary | ICD-10-CM | POA: Insufficient documentation

## 2023-02-13 MED ORDER — NAPROXEN 500 MG PO TABS
500.0000 mg | ORAL_TABLET | Freq: Two times a day (BID) | ORAL | 0 refills | Status: AC
  Filled 2023-02-13 (×2): qty 20, 10d supply, fill #0

## 2023-02-13 MED ORDER — PREDNISONE 10 MG PO TABS
ORAL_TABLET | ORAL | 0 refills | Status: AC
  Filled 2023-02-13: qty 21, 6d supply, fill #0

## 2023-02-13 NOTE — Discharge Instructions (Addendum)
You have been seen in the ED for left knee pain  Your xray does not show any acute findings for your pain  I have placed you in a knee immobilizer and you should follow up with Ortho - Please call the number provided tomorrow and schedule appt - You will most likely need MRI to eval tendon and ligaments for injury

## 2023-02-13 NOTE — ED Triage Notes (Signed)
Pt reports pain to left knee. Pt injured it 4 months ago and has been to several different hospitals but no one can tell her what is wrong with it. Pt states she feels like it pops out of place sometimes. Denies reinjury

## 2023-02-13 NOTE — ED Provider Notes (Signed)
[facility] Emergency Department Provider Note   ____________________________________________   None    (approximate)  I have reviewed the triage vital signs and the nursing notes.   HISTORY  Chief Complaint Knee Pain    HPI Hannah Reid is a 42 y.o. female c/o left knee pain  She reports that 4 months ago she was walking and had "something happen to my knee" and caused her to fall  She went to ED and xray showed no reason for concer Pain has gotten worse and is now hurting with ambulation more  She feels a popping sensation to to both medial and lateral aspect of knee but worse to the lateral aspect and this causing significant pain  Pain 10/10 sharp intermittently and 5/10 ache/throb constant  Pt has taken tylenol for the pain     Past Medical History:  Diagnosis Date   Anxiety    Depression    Hypertension     Patient Active Problem List   Diagnosis Date Noted   Elevated lipids 07/15/2022   GAD (generalized anxiety disorder) 07/07/2022   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   Cannabis use disorder 07/07/2022   Opioid use disorder 07/07/2022   MDD (major depressive disorder), recurrent severe, 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 Authorizing Provider  naproxen (NAPROSYN) 500 MG tablet Take 1 tablet (500 mg total) by mouth 2 (two) times daily with a meal for 10 days. 02/13/23 02/23/23 Yes Herschell Dimes, NP  predniSONE (STERAPRED UNI-PAK 21 TAB) 10 MG (21) TBPK tablet Take 6 pills day one and then decrease by 1 pill each day 02/13/23  Yes Herschell Dimes, NP  promethazine-dextromethorphan (PROMETHAZINE-DM) 6.25-15 MG/5ML syrup Take 5 mLs by mouth 4 (four) times daily as needed for cough. 09/14/22   LampteyBritta Mccreedy, MD  cefdinir (OMNICEF) 300 MG capsule Take 1 capsule (300 mg total) by mouth 2 (two) times daily. 09/13/22   Elson Areas, PA-C  gabapentin (NEURONTIN) 300 MG capsule Take 1 capsule (300 mg total) by mouth 3 (three) times daily. 01/06/23 02/05/23  Triplett, Rulon Eisenmenger B, FNP  ibuprofen (ADVIL) 800 MG tablet Take 1 tablet (800 mg total) by mouth every 8 (eight) hours as needed. 09/13/22   Elson Areas, PA-C  lisinopril (ZESTRIL) 5 MG tablet Take 1 tablet (5 mg total) by mouth once daily. 01/06/23   Triplett, Rulon Eisenmenger B, FNP  Melatonin 10 MG TBDP Take 10 mg by mouth at bedtime. 01/21/22   Tukov-Yual, Alroy Bailiff, NP  ondansetron (ZOFRAN-ODT) 4 MG disintegrating tablet Take 1 tablet (4 mg total) by mouth every 8 (eight) hours as needed for nausea or vomiting. 09/13/22   Elson Areas, PA-C  propranolol (INDERAL) 10 MG tablet Take 1 tablet (10 mg total) by mouth 2 (two) times daily. 07/15/22 08/14/22  Iloabachie, Chioma E, NP  QUEtiapine (SEROQUEL XR) 50 MG TB24 24 hr tablet Take 1 tablet (50 mg total)  by mouth daily for 14 days. 01/06/23 01/20/23  Triplett, Kasandra Knudsen, FNP  QUEtiapine (SEROQUEL) 300 MG tablet Take 1 tablet (300 mg total) by mouth at bedtime. 07/09/22 08/14/22  Massengill, Harrold Donath, MD    Allergies Augmentin [amoxicillin-pot clavulanate] and Amoxicillin  Family History  Problem Relation Age of Onset   Hyperlipidemia Mother    Cancer Mother        skin cancer   Heart disease Father    Hypertension Father    Heart attack Maternal Aunt    Other Maternal Grandmother        unknown medical history   Other Maternal Grandfather        unknown medical history   Alzheimer's disease  Paternal Grandmother    Heart disease Paternal Grandfather    Hypertension Paternal Grandfather     Social History Social History   Tobacco Use   Smoking status: Every Day    Packs/day: 0.30    Years: 25.00    Additional pack years: 0.00    Total pack years: 7.50    Types: Cigarettes   Smokeless tobacco: Never   Tobacco comments:    [place] Quit info given  Vaping Use   Vaping Use: Some days   Substances: Nicotine, THC, CBD, Flavoring  Substance Use Topics   Alcohol use: Yes    Comment: rarely   Drug use: Not Currently    Types: Marijuana, Benzodiazepines, Heroin    Comment: last use 02/16/17 (has used heroin once since 02/16/17)    Review of Systems  Constitutional: No fever/chills Eyes: No visual changes. ENT: No sore throat. Cardiovascular: Denies chest pain. Respiratory: Denies shortness of breath. Gastrointestinal: No abdominal pain.  No nausea, no vomiting.  No diarrhea.  No constipation. Genitourinary: Negative for dysuria. Musculoskeletal: Negative for back pain. Skin: Negative for rash. Neurological: Negative for headaches, focal weakness or numbness.   ____________________________________________   PHYSICAL EXAM:  VITAL SIGNS: ED Triage Vitals  Enc Vitals Group     BP 02/13/23 1600 (!) 135/93     Pulse Rate 02/13/23 1600 89     Resp 02/13/23 1600 18     Temp 02/13/23 1600 98.7 F (37.1 C)     Temp Source 02/13/23 1600 Oral     SpO2 02/13/23 1600 99 %     Weight 02/13/23 1532 244 lb 11.4 oz (111 kg)     Height 02/13/23 1532 5\' 4"  (1.626 m)     Head Circumference --      Peak Flow --      Pain Score 02/13/23 1531 10     Pain Loc --      Pain Edu? --      Excl. in GC? --     Constitutional: Alert and oriented. Well appearing and in no acute distress. Head: Atraumatic. Mouth/Throat: Mucous membranes are moist.  Cardiovascular: Normal rate, regular rhythm. Grossly normal heart sounds.  Good peripheral circulation. Respiratory: Normal respiratory  effort.  No retractions. Lungs CTAB. Gastrointestinal: Soft and nontender. No distention.  Musculoskeletal: Pt has pain to left knee with palpation, worse to the lateral aspect - She has limited ROM with flexion but can extend without difficulty - There is some bruising to the knee cap and sm effusion noted - Sensation is intact and pulses are normal  She is able to bear weight but not equally - There is no redness/warmth to the area  Neurologic:  Normal speech and language. No gross focal neurologic deficits are appreciated. No gait  instability. Skin:  Skin is warm, dry and intact. No rash noted. Psychiatric: Mood and affect are normal. Speech and behavior are normal.  ____________________________________________   LABS (all labs ordered are listed, but only abnormal results are displayed)  Labs Reviewed - No data to display ____________________________________________  EKG   ____________________________________________  RADIOLOGY  ED MD interpretation:  xray reviewed by me and read by radiologist   Official radiology report(s): DG Knee Complete 4 Views Left  Result Date: 02/13/2023 CLINICAL DATA:  Injury to the left knee for months ago with persistent pain and bruising EXAM: LEFT KNEE - COMPLETE 4 VIEW COMPARISON:  None Available. FINDINGS: No evidence of fracture, dislocation, or joint effusion. No evidence of arthropathy or other focal bone abnormality. Ill-defined subcutaneous soft tissue densities projecting over the lateral aspect of the knee. IMPRESSION: 1. No acute fracture or dislocation. 2. Ill-defined subcutaneous soft tissue densities projecting over the lateral aspect of the knee, which may represent hematomas or prominent superficial vessels. Electronically Signed   By: Agustin Cree M.D.   On: 02/13/2023 16:19    ____________________________________________   PROCEDURES  Procedure(s) performed: None  Procedures  Critical Care performed:  No  ____________________________________________   INITIAL IMPRESSION / ASSESSMENT AND PLAN / ED COURSE     Nasiyah Welshans Towell is a 42 y.o. female c/o left knee pain  She reports that 4 months ago she was walking and had "something happen to my knee" and caused her to fall  She went to ED and xray showed no reason for concer Pain has gotten worse and is now hurting with ambulation more  She feels a popping sensation to to both medial and lateral aspect of knee but worse to the lateral aspect and this causing significant pain  Pain 10/10 sharp intermittently and 5/10 ache/throb constant  Pt has taken tylenol for the pain   Xray shows no acute fracture or dislocation  Exam reveals concern for possible ligament or tendon injury to lateral knee - She will most likely require MRI to further eval  Given chronicity of injury, will refer to Ortho  Will place in knee immobilizer unless in bed or shower  Give prednisone taper and naproxen  Will discharge pt home in stable condition at this time       ____________________________________________   FINAL CLINICAL IMPRESSION(S) / ED DIAGNOSES  Final diagnoses:  Chronic pain of left knee     ED Discharge Orders          Ordered    predniSONE (STERAPRED UNI-PAK 21 TAB) 10 MG (21) TBPK tablet        02/13/23 1825    naproxen (NAPROSYN) 500 MG tablet  2 times daily with meals        02/13/23 1825             Note:  This document was prepared using Dragon voice recognition software and may include unintentional dictation errors.     Herschell Dimes, NP 02/13/23 Herbie Baltimore    Sharman Cheek, MD 02/14/23 [phone]

## 2023-02-14 ENCOUNTER — Other Ambulatory Visit: Payer: Self-pay

## 2023-02-18 ENCOUNTER — Emergency Department
Admission: EM | Admit: 2023-02-18 | Discharge: 2023-02-18 | Disposition: A | Discharge status: Home / Self Care | Payer: Self-pay | Attending: Emergency Medicine | Admitting: Emergency Medicine

## 2023-02-18 ENCOUNTER — Encounter: Payer: Self-pay | Admitting: Emergency Medicine

## 2023-02-18 ENCOUNTER — Other Ambulatory Visit: Payer: Self-pay

## 2023-02-18 ENCOUNTER — Emergency Department: Payer: Self-pay

## 2023-02-18 DIAGNOSIS — R112 Nausea with vomiting, unspecified: Secondary | ICD-10-CM

## 2023-02-18 DIAGNOSIS — Z1152 Encounter for screening for COVID-19: Secondary | ICD-10-CM | POA: Insufficient documentation

## 2023-02-18 DIAGNOSIS — B349 Viral infection, unspecified: Secondary | ICD-10-CM

## 2023-02-18 LAB — COMPREHENSIVE METABOLIC PANEL
ALT: 18 U/L (ref 0–44)
AST: 17 U/L (ref 15–41)
Albumin: 4.5 g/dL (ref 3.5–5.0)
Alkaline Phosphatase: 58 U/L (ref 38–126)
Anion gap: 9 (ref 5–15)
BUN: 15 mg/dL (ref 6–20)
CO2: 24 mmol/L (ref 22–32)
Calcium: 9 mg/dL (ref 8.9–10.3)
Chloride: 105 mmol/L (ref 98–111)
Creatinine, Ser: 0.8 mg/dL (ref 0.44–1.00)
GFR, Estimated: >60 mL/min (ref >60)
Glucose, Bld: 88 mg/dL (ref 70–99)
Potassium: 4.2 mmol/L (ref 3.5–5.1)
Sodium: 138 mmol/L (ref 135–145)
Total Bilirubin: 0.6 mg/dL (ref 0.3–1.2)
Total Protein: 7.6 g/dL (ref 6.5–8.1)

## 2023-02-18 LAB — URINALYSIS, ROUTINE W REFLEX MICROSCOPIC
Bilirubin Urine: NEGATIVE
Glucose, UA: NEGATIVE mg/dL
Hgb urine dipstick: NEGATIVE
Ketones, ur: NEGATIVE mg/dL
Leukocytes,Ua: NEGATIVE
Nitrite: NEGATIVE
Protein, ur: NEGATIVE mg/dL
Specific Gravity, Urine: 1.024 (ref 1.005–1.030)
pH: 5 (ref 5.0–8.0)

## 2023-02-18 LAB — CBC
HCT: 45 % (ref 36.0–46.0)
Hemoglobin: 15.1 g/dL — ABNORMAL HIGH (ref 12.0–15.0)
MCH: 30.1 pg (ref 26.0–34.0)
MCHC: 33.6 g/dL (ref 30.0–36.0)
MCV: 89.8 fL (ref 80.0–100.0)
Platelets: 344 10*3/uL (ref 150–400)
RBC: 5.01 MIL/uL (ref 3.87–5.11)
RDW: 12.6 % (ref 11.5–15.5)
WBC: 11.5 10*3/uL — ABNORMAL HIGH (ref 4.0–10.5)
nRBC: 0 % (ref 0.0–0.2)

## 2023-02-18 LAB — RESP PANEL BY RT-PCR (RSV, FLU A&B, COVID)  RVPGX2
Influenza A by PCR: NEGATIVE
Influenza B by PCR: NEGATIVE
Resp Syncytial Virus by PCR: NEGATIVE
SARS Coronavirus 2 by RT PCR: NEGATIVE

## 2023-02-18 LAB — LIPASE, BLOOD: Lipase: 32 U/L (ref 11–51)

## 2023-02-18 MED ORDER — ONDANSETRON 4 MG PO TBDP
4.0000 mg | ORAL_TABLET | Freq: Three times a day (TID) | ORAL | 0 refills | Status: AC | PRN
  Filled 2023-02-18: qty 12, 4d supply, fill #0

## 2023-02-18 MED ORDER — ONDANSETRON 4 MG PO TBDP
4.0000 mg | ORAL_TABLET | Freq: Once | ORAL | Status: AC
  Administered 2023-02-18: 4 mg via ORAL
  Filled 2023-02-18: qty 1

## 2023-02-18 NOTE — ED Notes (Signed)
See triage note  Presents with some n/v this am  Denies any fever  Has had some ear pain and headache

## 2023-02-18 NOTE — ED Triage Notes (Signed)
Pt to ED for emesis started this am. Also reports left ear pain with headache and sinus congestion for months.

## 2023-02-18 NOTE — ED Provider Notes (Signed)
[facility] Provider Note    Event Date/Time   First MD Initiated Contact with Patient 02/18/23 1209     (approximate)   History   Chief Complaint Emesis   HPI  Hannah Reid is a 42 y.o. female with no significant past medical history who presents to the ED complaining of nausea and vomiting.  Patient reports that over the past couple of days she has been dealing with cough, congestion, postnasal drip, and some mild difficulty breathing.  She woke up earlier this morning feeling nauseous and has had multiple episodes of vomiting and diarrhea today.  She denies any pain in her abdomen and has not had any fevers, dysuria, or flank pain.  She has dealt with intermittent pain in her left ear for multiple months, does state that ear pain has increased with the onset of the symptoms.     Physical Exam   Triage Vital Signs: ED Triage Vitals  Enc Vitals Group     BP 02/18/23 1154 (!) 136/104     Pulse Rate 02/18/23 1154 88     Resp 02/18/23 1154 18     Temp 02/18/23 1156 98.2 F (36.8 C)     Temp src --      SpO2 02/18/23 1154 99 %     Weight 02/18/23 1215 244 lb 11.4 oz (111 kg)     Height 02/18/23 1153 5\' 4"  (1.626 m)     Head Circumference --      Peak Flow --      Pain Score 02/18/23 1153 10     Pain Loc --      Pain Edu? --      Excl. in GC? --     Most recent vital signs: Vitals:   02/18/23 1156 02/18/23 1228  BP:  (!) 130/98  Pulse:  80  Resp:  18  Temp: 98.2 F (36.8 C)   SpO2:  99%    Constitutional: Alert and oriented. Eyes: Conjunctivae are normal. Head: Atraumatic. Nose: No congestion/rhinnorhea. Mouth/Throat: Mucous membranes are moist.  Ears: Right TM clear, left TM mildly erythematous with no bulging or purulence. Cardiovascular: Normal rate, regular rhythm. Grossly normal heart sounds.  2+ radial pulses bilaterally. Respiratory: Normal respiratory effort.  No retractions. Lungs CTAB. Gastrointestinal: Soft and nontender. No  distention. Musculoskeletal: No lower extremity tenderness nor edema.  Neurologic:  Normal speech and language. No gross focal neurologic deficits are appreciated.    ED Results / Procedures / Treatments   Labs (all labs ordered are listed, but only abnormal results are displayed) Labs Reviewed  CBC - Abnormal; Notable for the following components:      Result Value   WBC 11.5 (*)    Hemoglobin 15.1 (*)    All other components within normal limits  URINALYSIS, ROUTINE W REFLEX MICROSCOPIC - Abnormal; Notable for the following components:   Color, Urine YELLOW (*)    APPearance CLEAR (*)    All other components within normal limits  RESP PANEL BY RT-PCR (RSV, FLU A&B, COVID)  RVPGX2  LIPASE, BLOOD  COMPREHENSIVE METABOLIC PANEL   RADIOLOGY Chest x-ray reviewed and interpreted by me with no infiltrate, edema, or effusion.  PROCEDURES:  Critical Care performed: No  Procedures   MEDICATIONS ORDERED IN ED: Medications  ondansetron (ZOFRAN-ODT) disintegrating tablet 4 mg (4 mg Oral Given 02/18/23 1238)     IMPRESSION / MDM / ASSESSMENT AND PLAN / ED COURSE  I reviewed the triage vital signs  and the nursing notes.                              42 y.o. female with no significant past medical history who presents to the ED complaining of a couple of days of cough, congestion, postnasal drip, mild difficulty breathing, nausea, vomiting, and diarrhea.  Patient's presentation is most consistent with acute presentation with potential threat to life or bodily function.  Differential diagnosis includes, but is not limited to, viral illness, COVID-19, influenza, dehydration, electrolyte abnormality, AKI, UTI, pneumonia, otitis media.  Patient nontoxic-appearing and in no acute distress, vital signs are unremarkable.  Labs are reassuring with no significant anemia, leukocytosis, lecture abnormality, or AKI.  LFTs and lipase are also unremarkable, urinalysis and chest x-ray without  signs of infection.  Left TM mildly erythematous but without bulging or purulence, suspect your discomfort is part of her viral syndrome.  Testing for COVID and flu is pending at this time, patient appropriate for outpatient management, tolerating oral intake without difficulty following dose of ODT Zofran.  Will prescribe Zofran and she was counseled to establish care with PCP, otherwise return to the ED for new or worsening symptoms.  Patient agrees with plan.      FINAL CLINICAL IMPRESSION(S) / ED DIAGNOSES   Final diagnoses:  Viral syndrome  Nausea vomiting and diarrhea     Rx / DC Orders   ED Discharge Orders          Ordered    ondansetron (ZOFRAN-ODT) 4 MG disintegrating tablet  Every 8 hours PRN        02/18/23 1330             Note:  This document was prepared using Dragon voice recognition software and may include unintentional dictation errors.   Chesley Noon, MD 02/18/23 [phone]

## 2023-08-23 IMAGING — CT CT HEAD W/O CM
4 series · 16 of 47 positions shown, 18 images · non-contrast
Comparison: None Available.

CLINICAL DATA: Mental status change, unknown cause



[Series 2: head wo · axial · 0.42mm/px · z∈[+256,+371]mm · 7 of 31 slices shown, 9 images]
[im 4/31  brain]
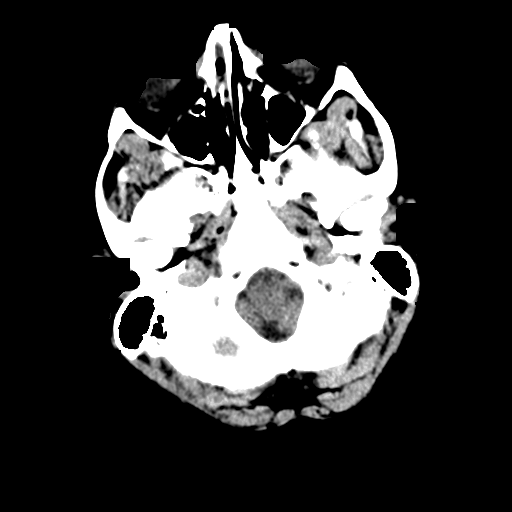
[im 4/31  bone]
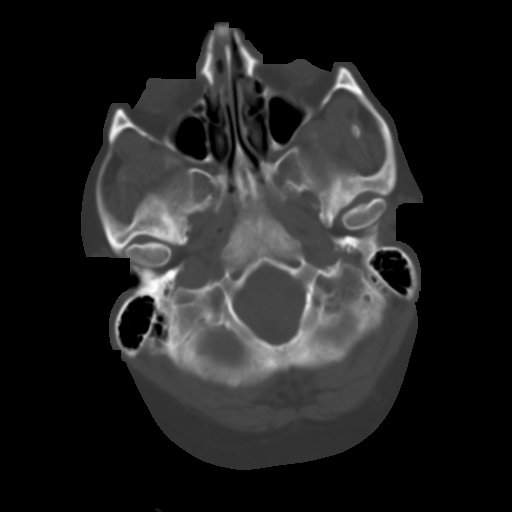
[im 8/31  brain]
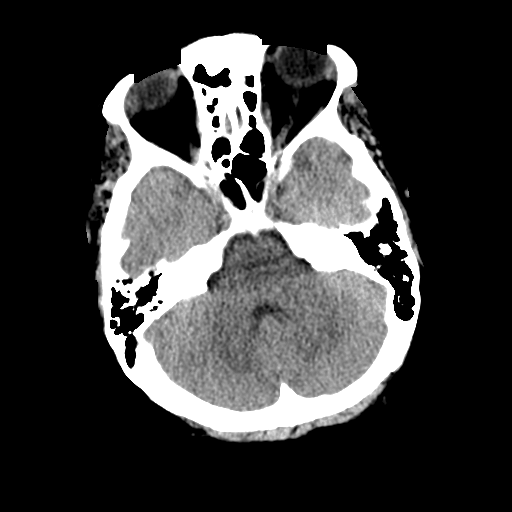
[im 12/31  brain]
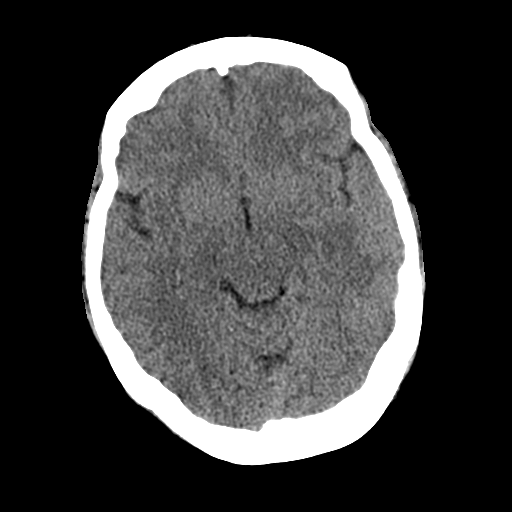
[im 16/31  brain]
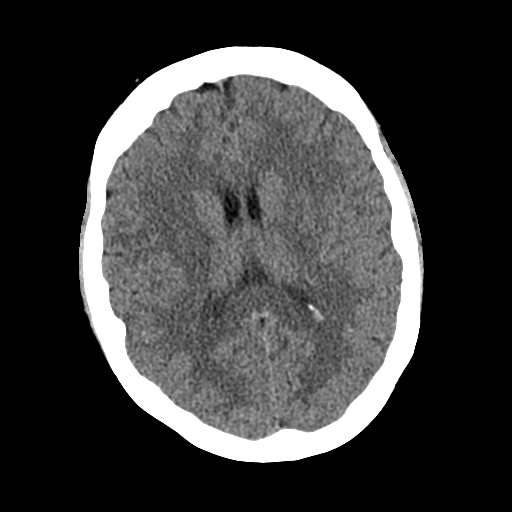
[im 19/31  brain]
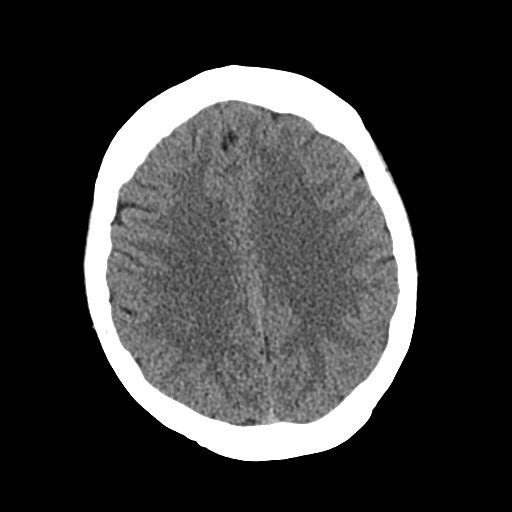
[im 19/31  bone]
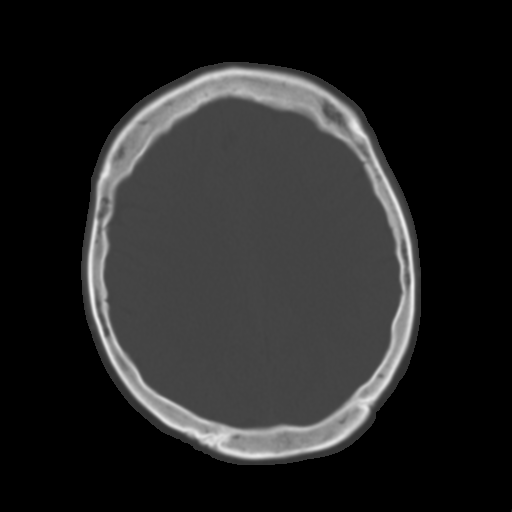
[im 23/31  brain]
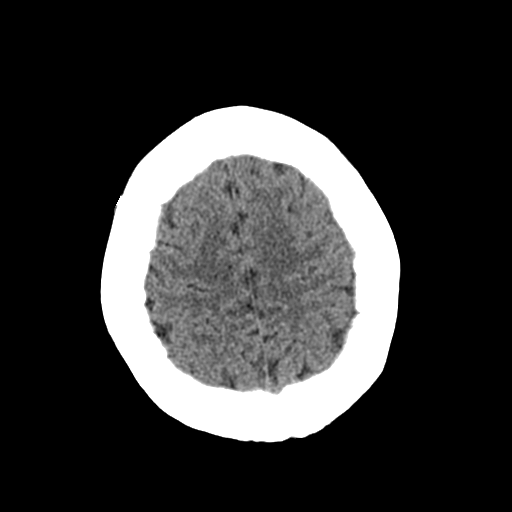
[im 27/31  brain]
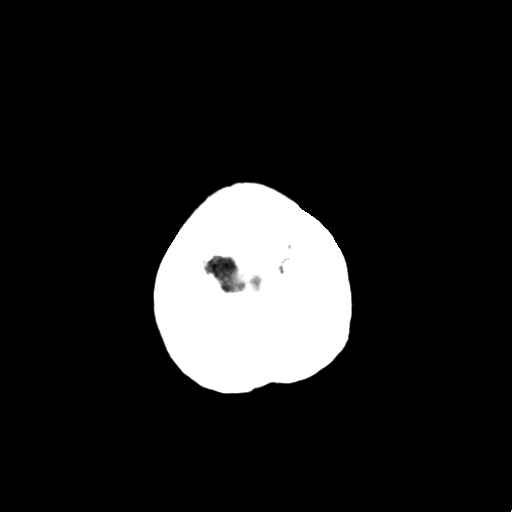

[Series 3: head bone · axial · 0.42mm/px · z∈[+255,+285]mm · 3 of 76 slices shown]
[im 8/76  bone]
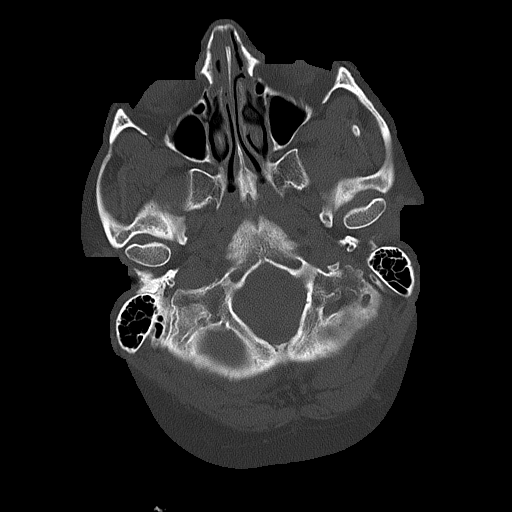
[im 16/76  bone]
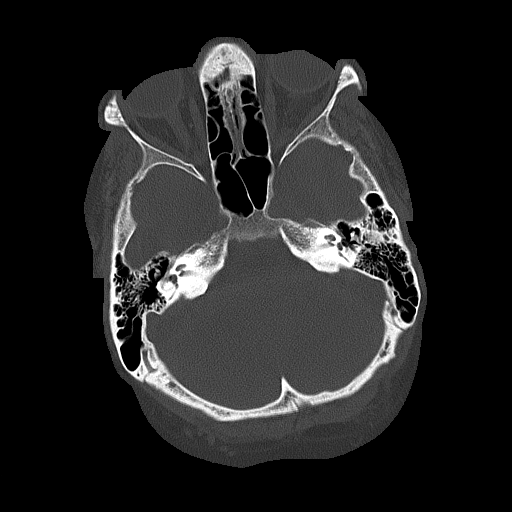
[im 23/76  bone]
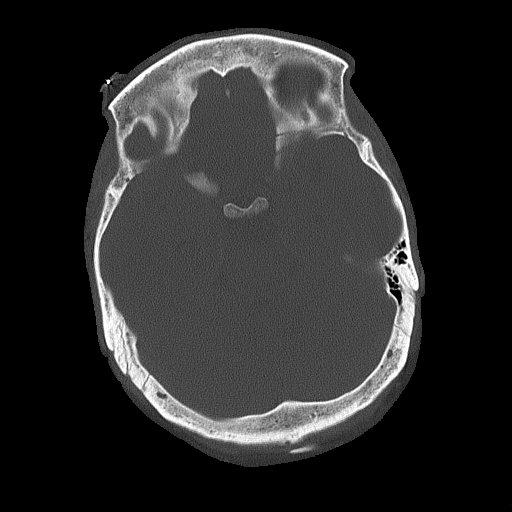

[Series 4: coronal soft tissue · coronal · 0.31mm/px · 3 of 63 slices shown]
[im 21/63  brain]
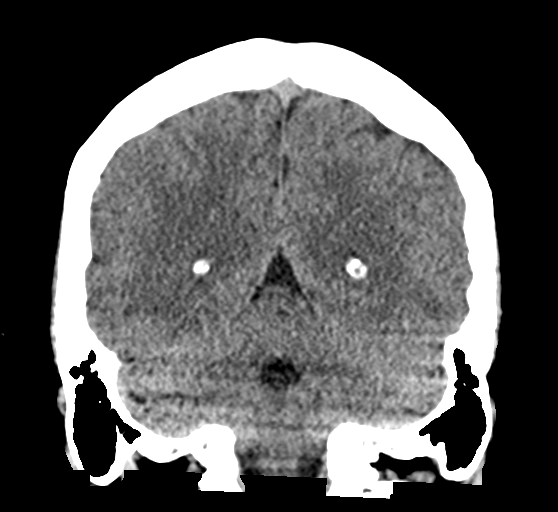
[im 28/63  brain]
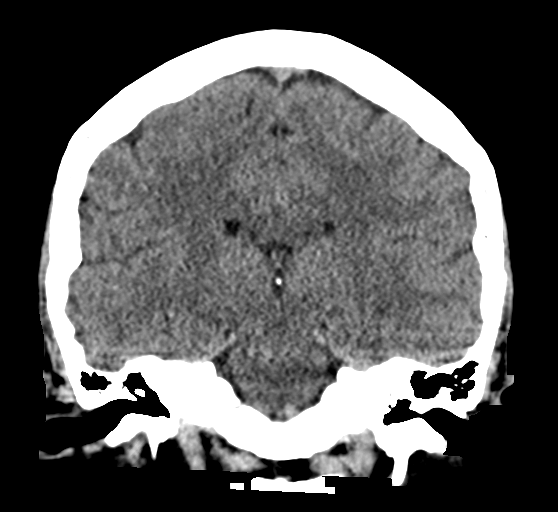
[im 35/63  brain]
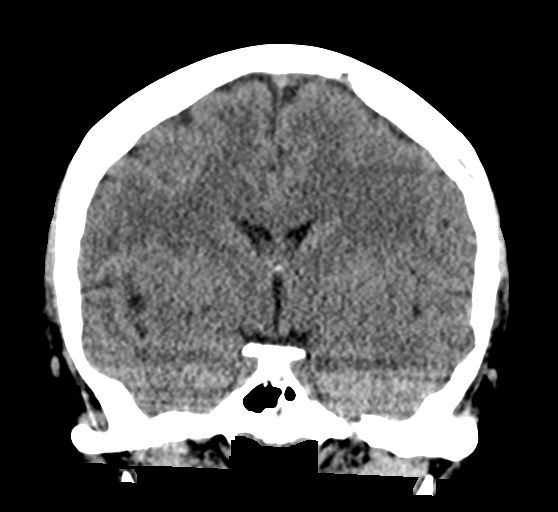

[Series 5: sagittal soft tissue · sagittal · 0.31mm/px · 3 of 54 slices shown]
[im 18/54  brain]
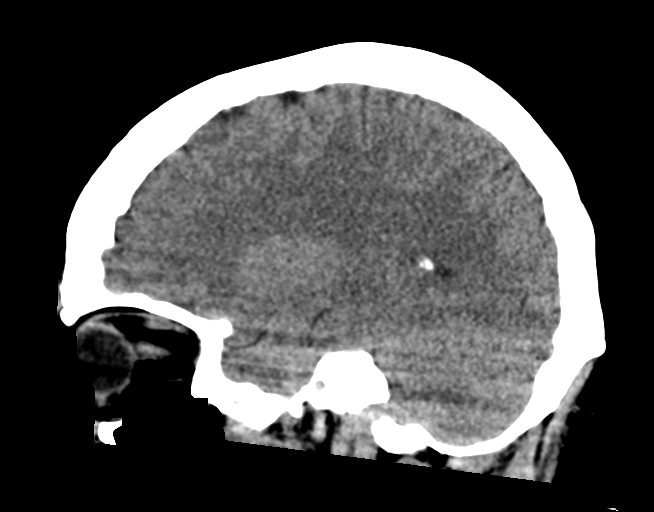
[im 27/54  brain]
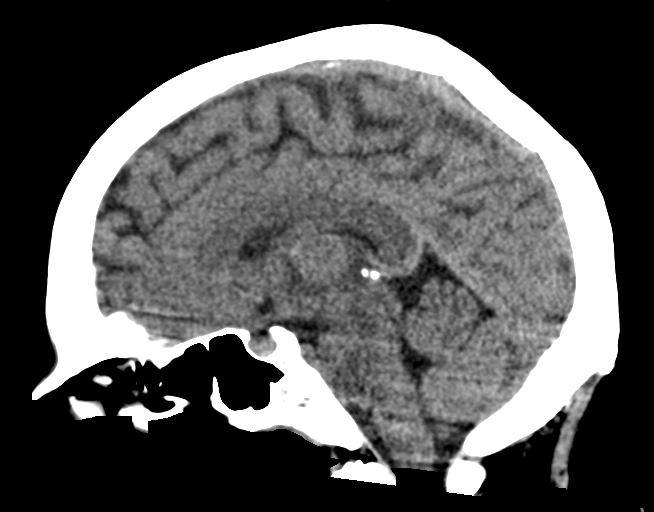
[im 36/54  brain]
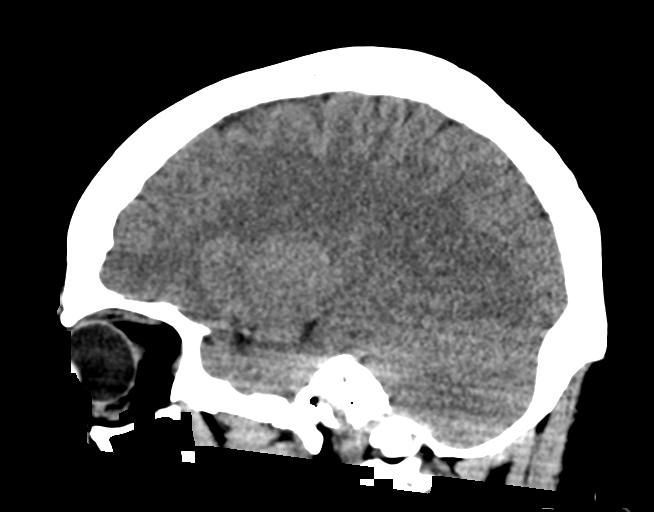

[16 of 47 positions shown; findings below may reference images not displayed]

FINDINGS: Brain: Normal anatomic configuration. No abnormal intra or
extra-axial mass lesion or fluid collection. No abnormal mass effect
or midline shift. No evidence of acute intracranial hemorrhage or
infarct. Ventricular size is normal. Cerebellum unremarkable.

Vascular: Unremarkable

Skull: Intact

Sinuses/Orbits: Paranasal sinuses are clear. Orbits are
unremarkable.

Other: Mastoid air cells and middle ear cavities are clear.
IMPRESSION: No acute intracranial abnormality.
